# Patient Record
Sex: Female | Born: 1995 | Race: Black or African American | Hispanic: No | Marital: Single | Smoking: Never smoker
Health system: Southern US, Community
[De-identification: ages and names within clinical notes are randomized; demographics above are authoritative.]

## PROBLEM LIST (undated history)

## (undated) ENCOUNTER — Ambulatory Visit: Source: Home / Self Care

## (undated) ENCOUNTER — Emergency Department (HOSPITAL_COMMUNITY): Payer: Medicaid Other | Source: Home / Self Care

## (undated) ENCOUNTER — Inpatient Hospital Stay (HOSPITAL_COMMUNITY): Payer: Self-pay

## (undated) DIAGNOSIS — J45909 Unspecified asthma, uncomplicated: Secondary | ICD-10-CM

## (undated) DIAGNOSIS — K8020 Calculus of gallbladder without cholecystitis without obstruction: Secondary | ICD-10-CM

## (undated) DIAGNOSIS — J3089 Other allergic rhinitis: Secondary | ICD-10-CM

## (undated) DIAGNOSIS — K5900 Constipation, unspecified: Secondary | ICD-10-CM

## (undated) HISTORY — DX: Calculus of gallbladder without cholecystitis without obstruction: K80.20

## (undated) HISTORY — DX: Constipation, unspecified: K59.00

---
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
## 2022-01-06 ENCOUNTER — Encounter: Payer: Self-pay | Admitting: Obstetrics and Gynecology

## 2022-01-20 ENCOUNTER — Encounter: Payer: Self-pay | Admitting: Obstetrics and Gynecology

## 2022-04-04 ENCOUNTER — Encounter: Payer: Medicaid Other | Admitting: Obstetrics and Gynecology

## 2022-04-07 ENCOUNTER — Encounter: Payer: Self-pay | Admitting: Obstetrics and Gynecology

## 2022-04-07 ENCOUNTER — Ambulatory Visit (INDEPENDENT_AMBULATORY_CARE_PROVIDER_SITE_OTHER): Payer: Medicaid Other | Admitting: Obstetrics and Gynecology

## 2022-04-07 DIAGNOSIS — Z3046 Encounter for surveillance of implantable subdermal contraceptive: Secondary | ICD-10-CM | POA: Diagnosis not present

## 2022-04-07 DIAGNOSIS — Z30011 Encounter for initial prescription of contraceptive pills: Secondary | ICD-10-CM | POA: Diagnosis not present

## 2022-04-07 DIAGNOSIS — Z309 Encounter for contraceptive management, unspecified: Secondary | ICD-10-CM | POA: Insufficient documentation

## 2022-04-07 MED ORDER — DESOGESTREL-ETHINYL ESTRADIOL 0.15-30 MG-MCG PO TABS: 1.0000 | ORAL_TABLET | Freq: Every day | ORAL | 11 refills | Status: DC

## 2022-04-07 NOTE — Progress Notes (Signed)
? ? ? ?  GYNECOLOGY CLINIC PROCEDURE NOTE ? ?Leslie Henderson is a 26 y.o. No obstetric history on file, here for Nexplanon removal. Last pap smear was on 2020 and was normal. ? ? ?Nexplanon Removal ?Patient identified, informed consent performed, consent signed.   Appropriate time out taken. Nexplanon site identified.  Area prepped in usual sterile fashon. One ml of 1% lidocaine was used to anesthetize the area at the distal end of the implant. A small stab incision was made right beside the implant on the distal portion.  The Nexplanon rod was grasped using hemostats and removed without difficulty.  There was minimal blood loss. There were no complications.  3 ml of 1% lidocaine was injected around the incision for post-procedure analgesia.  Steri-strips were applied over the small incision.  A pressure bandage was applied to reduce any bruising.  The patient tolerated the procedure well and was given post procedure instructions.  Patient is planning to use OCP for contraception. ?U/R/B and back up method for OCP's reviewed. To start this Sunday ?Return in 2-3 weeks for yearly GYN exam.  ? ? ?Arlina Robes MD, FACOG ?Attending [place] for [employer], [place] ? ?  ?

## 2022-04-07 NOTE — Patient Instructions (Signed)

## 2022-04-07 NOTE — Progress Notes (Signed)
Pt tsates having vaginal odor, wants test. ?

## 2022-04-21 ENCOUNTER — Telehealth: Payer: Self-pay | Admitting: Family Medicine

## 2022-04-21 ENCOUNTER — Ambulatory Visit: Payer: BLUE CROSS/BLUE SHIELD

## 2022-04-21 NOTE — Telephone Encounter (Signed)
Called patient for our July schedule for an annual w/ a pap smear for after 4th ?

## 2022-08-16 ENCOUNTER — Ambulatory Visit: Payer: Self-pay

## 2022-10-17 ENCOUNTER — Encounter (HOSPITAL_BASED_OUTPATIENT_CLINIC_OR_DEPARTMENT_OTHER): Payer: Self-pay | Admitting: Emergency Medicine

## 2022-10-17 ENCOUNTER — Emergency Department (HOSPITAL_BASED_OUTPATIENT_CLINIC_OR_DEPARTMENT_OTHER)
Admission: EM | Admit: 2022-10-17 | Discharge: 2022-10-18 | Disposition: A | Discharge status: Home / Self Care | Payer: BLUE CROSS/BLUE SHIELD | Attending: Emergency Medicine | Admitting: Emergency Medicine

## 2022-10-17 ENCOUNTER — Other Ambulatory Visit: Payer: Self-pay

## 2022-10-17 DIAGNOSIS — R1013 Epigastric pain: Secondary | ICD-10-CM

## 2022-10-17 DIAGNOSIS — K8020 Calculus of gallbladder without cholecystitis without obstruction: Secondary | ICD-10-CM | POA: Insufficient documentation

## 2022-10-17 LAB — COMPREHENSIVE METABOLIC PANEL
ALT: 19 U/L (ref 0–44)
AST: 20 U/L (ref 15–41)
Albumin: 3.7 g/dL (ref 3.5–5.0)
Alkaline Phosphatase: 59 U/L (ref 38–126)
Anion gap: 8 (ref 5–15)
BUN: 11 mg/dL (ref 6–20)
CO2: 25 mmol/L (ref 22–32)
Calcium: 8.8 mg/dL — ABNORMAL LOW (ref 8.9–10.3)
Chloride: 107 mmol/L (ref 98–111)
Creatinine, Ser: 0.78 mg/dL (ref 0.44–1.00)
GFR, Estimated: >60 mL/min (ref >60)
Glucose, Bld: 94 mg/dL (ref 70–99)
Potassium: 3.4 mmol/L — ABNORMAL LOW (ref 3.5–5.1)
Sodium: 140 mmol/L (ref 135–145)
Total Bilirubin: 0.3 mg/dL (ref 0.3–1.2)
Total Protein: 7.9 g/dL (ref 6.5–8.1)

## 2022-10-17 LAB — CBC WITH DIFFERENTIAL/PLATELET
Abs Immature Granulocytes: 0.01 10*3/uL (ref 0.00–0.07)
Basophils Absolute: 0 10*3/uL (ref 0.0–0.1)
Basophils Relative: 1 %
Eosinophils Absolute: 0.2 10*3/uL (ref 0.0–0.5)
Eosinophils Relative: 3 %
HCT: 38.8 % (ref 36.0–46.0)
Hemoglobin: 12.6 g/dL (ref 12.0–15.0)
Immature Granulocytes: 0 %
Lymphocytes Relative: 44 %
Lymphs Abs: 2.5 10*3/uL (ref 0.7–4.0)
MCH: 26.3 pg (ref 26.0–34.0)
MCHC: 32.5 g/dL (ref 30.0–36.0)
MCV: 81 fL (ref 80.0–100.0)
Monocytes Absolute: 0.6 10*3/uL (ref 0.1–1.0)
Monocytes Relative: 11 %
Neutro Abs: 2.3 10*3/uL (ref 1.7–7.7)
Neutrophils Relative %: 41 %
Platelets: 306 10*3/uL (ref 150–400)
RBC: 4.79 MIL/uL (ref 3.87–5.11)
RDW: 13.5 % (ref 11.5–15.5)
WBC: 5.7 10*3/uL (ref 4.0–10.5)
nRBC: 0 % (ref 0.0–0.2)

## 2022-10-17 LAB — URINALYSIS, ROUTINE W REFLEX MICROSCOPIC
Bilirubin Urine: NEGATIVE
Glucose, UA: NEGATIVE mg/dL
Hgb urine dipstick: NEGATIVE
Ketones, ur: NEGATIVE mg/dL
Leukocytes,Ua: NEGATIVE
Nitrite: NEGATIVE
Protein, ur: 30 mg/dL — AB
Specific Gravity, Urine: 1.02 (ref 1.005–1.030)
pH: 7.5 (ref 5.0–8.0)

## 2022-10-17 LAB — URINALYSIS, MICROSCOPIC (REFLEX): WBC, UA: NONE SEEN WBC/hpf (ref 0–5)

## 2022-10-17 LAB — LIPASE, BLOOD: Lipase: 31 U/L (ref 11–51)

## 2022-10-17 LAB — TROPONIN I (HIGH SENSITIVITY): Troponin I (High Sensitivity): <2 ng/L (ref <18)

## 2022-10-17 LAB — PREGNANCY, URINE: Preg Test, Ur: NEGATIVE

## 2022-10-17 NOTE — ED Provider Notes (Signed)
[facility] Provider Note   CSN: [id number] Arrival date & time: 10/17/22  1735     History  Chief Complaint  Patient presents with   Abdominal Pain    EPIGASTRIC    Leslie Henderson is a 26 y.o. female.  Patient is a 26 year old female with past medical history of cholelithiasis diagnosed at the age of 83, but not intervened on.  Patient presenting today with complaints of epigastric pain.  This has been bothering her off and on for the past week.  It seems to be worse after she eats spicy or fatty foods.  She denies any fevers or chills.  She denies any vomiting, diarrhea, or constipation.  There are no alleviating factors when the pain begins.  The history is provided by the patient.  Abdominal Pain Pain location:  Epigastric Pain quality: cramping   Pain radiates to:  Does not radiate Pain severity:  Moderate Duration:  1 week Timing:  Intermittent Progression:  Worsening Chronicity:  Recurrent      Home Medications Prior to Admission medications   Medication Sig Start Date End Date Taking? Authorizing Provider  desogestrel-ethinyl estradiol (APRI) 0.15-30 MG-MCG tablet Take 1 tablet by mouth daily. 04/07/22 04/07/23  Chancy Milroy, MD      Allergies    Patient has no known allergies.    Review of Systems   Review of Systems  Gastrointestinal:  Positive for abdominal pain.  All other systems reviewed and are negative.   Physical Exam Updated Vital Signs BP (!) 139/55   Pulse 71   Temp 98.3 F (36.8 C)   Resp 16   Ht 5\' 4"  (1.626 m)   Wt 108.9 kg   LMP 09/30/2022   SpO2 99%   BMI 41.20 kg/m  Physical Exam Vitals and nursing note reviewed.  Constitutional:      General: She is not in acute distress.    Appearance: She is well-developed. She is not diaphoretic.  HENT:     Head: Normocephalic and atraumatic.  Cardiovascular:     Rate and Rhythm: Normal rate and regular rhythm.     Heart sounds: No murmur heard.     No friction rub. No gallop.  Pulmonary:     Effort: Pulmonary effort is normal. No respiratory distress.     Breath sounds: Normal breath sounds. No wheezing.  Abdominal:     General: Bowel sounds are normal. There is no distension.     Palpations: Abdomen is soft.     Tenderness: There is abdominal tenderness in the epigastric area. There is no right CVA tenderness, left CVA tenderness, guarding or rebound.  Musculoskeletal:        General: Normal range of motion.     Cervical back: Normal range of motion and neck supple.  Skin:    General: Skin is warm and dry.  Neurological:     General: No focal deficit present.     Mental Status: She is alert and oriented to person, place, and time.     ED Results / Procedures / Treatments   Labs (all labs ordered are listed, but only abnormal results are displayed) Labs Reviewed  COMPREHENSIVE METABOLIC PANEL - Abnormal; Notable for the following components:      Result Value   Potassium 3.4 (*)    Calcium 8.8 (*)    All other components within normal limits  URINALYSIS, ROUTINE W REFLEX MICROSCOPIC - Abnormal; Notable for the following components:   Protein, ur  30 (*)    All other components within normal limits  URINALYSIS, MICROSCOPIC (REFLEX) - Abnormal; Notable for the following components:   Bacteria, UA RARE (*)    All other components within normal limits  LIPASE, BLOOD  PREGNANCY, URINE  CBC WITH DIFFERENTIAL/PLATELET  TROPONIN I (HIGH SENSITIVITY)  TROPONIN I (HIGH SENSITIVITY)    EKG None  Radiology No results found.  Procedures Procedures  {Document cardiac monitor, telemetry assessment procedure when appropriate:1}  Medications Ordered in ED Medications - No data to display  ED Course/ Medical Decision Making/ A&P                           Medical Decision Making Amount and/or Complexity of Data Reviewed Labs: ordered. Radiology: ordered.   ***  {Document critical care time when  appropriate:1} {Document review of labs and clinical decision tools ie heart score, Chads2Vasc2 etc:1}  {Document your independent review of radiology images, and any outside records:1} {Document your discussion with family members, caretakers, and with consultants:1} {Document social determinants of health affecting pt's care:1} {Document your decision making why or why not admission, treatments were needed:1} Final Clinical Impression(s) / ED Diagnoses Final diagnoses:  None    Rx / DC Orders ED Discharge Orders     None

## 2022-10-17 NOTE — ED Triage Notes (Signed)
Intermittent epigastric abdominal pain x 1 week. Reports hx of gallstones. Thinks sx are related. Pain worsened after eating spicy foods per pt.

## 2022-10-18 ENCOUNTER — Emergency Department (HOSPITAL_BASED_OUTPATIENT_CLINIC_OR_DEPARTMENT_OTHER): Payer: BLUE CROSS/BLUE SHIELD

## 2022-10-18 ENCOUNTER — Telehealth (HOSPITAL_BASED_OUTPATIENT_CLINIC_OR_DEPARTMENT_OTHER): Payer: Self-pay | Admitting: Emergency Medicine

## 2022-10-18 DIAGNOSIS — K8020 Calculus of gallbladder without cholecystitis without obstruction: Secondary | ICD-10-CM | POA: Diagnosis not present

## 2022-10-18 MED ORDER — HYDROCODONE-ACETAMINOPHEN 5-325 MG PO TABS: 1.0000 | ORAL_TABLET | Freq: Four times a day (QID) | ORAL | 0 refills | Status: DC | PRN

## 2022-10-18 NOTE — Discharge Instructions (Addendum)
Avoid spicy or fatty foods as this may worsen your discomfort.  Take ibuprofen 600 mg every 6 hours as needed for pain.  Take hydrocodone as prescribed as needed for severe pain not relieved with ibuprofen.  Follow-up with general surgery in the next few days.  The contact information for [facility] surgery has been provided in this discharge summary for you to call and make these arrangements.  Return to the emergency department if you develop severe pain, high fevers, bloody stools, or for other new and concerning symptoms.

## 2022-10-18 NOTE — ED Notes (Signed)
US at bedside

## 2022-10-18 NOTE — ED Notes (Signed)
Provider bedside.

## 2022-10-18 NOTE — Telephone Encounter (Signed)
Patient needs meds sent to [facility]. Norco cancelled at [facility]

## 2023-01-02 DIAGNOSIS — A599 Trichomoniasis, unspecified: Secondary | ICD-10-CM | POA: Diagnosis not present

## 2023-01-02 DIAGNOSIS — B9689 Other specified bacterial agents as the cause of diseases classified elsewhere: Secondary | ICD-10-CM | POA: Diagnosis not present

## 2023-01-02 DIAGNOSIS — Z113 Encounter for screening for infections with a predominantly sexual mode of transmission: Secondary | ICD-10-CM | POA: Diagnosis not present

## 2023-01-02 DIAGNOSIS — N760 Acute vaginitis: Secondary | ICD-10-CM | POA: Diagnosis not present

## 2023-11-14 ENCOUNTER — Other Ambulatory Visit: Payer: Self-pay

## 2023-11-14 DIAGNOSIS — Z3490 Encounter for supervision of normal pregnancy, unspecified, unspecified trimester: Secondary | ICD-10-CM

## 2023-11-14 MED ORDER — PRENATAL 28-0.8 MG PO TABS: 1.0000 | ORAL_TABLET | Freq: Every day | ORAL | 12 refills | Status: DC

## 2023-11-23 ENCOUNTER — Encounter (HOSPITAL_COMMUNITY): Payer: Self-pay

## 2023-11-23 ENCOUNTER — Emergency Department (HOSPITAL_COMMUNITY)
Admission: EM | Admit: 2023-11-23 | Discharge: 2023-11-24 | Disposition: A | Discharge status: Home / Self Care | Payer: Medicaid Other | Attending: Emergency Medicine | Admitting: Emergency Medicine

## 2023-11-23 DIAGNOSIS — Z3A00 Weeks of gestation of pregnancy not specified: Secondary | ICD-10-CM | POA: Diagnosis not present

## 2023-11-23 DIAGNOSIS — O210 Mild hyperemesis gravidarum: Secondary | ICD-10-CM | POA: Diagnosis not present

## 2023-11-23 DIAGNOSIS — O219 Vomiting of pregnancy, unspecified: Secondary | ICD-10-CM | POA: Diagnosis present

## 2023-11-23 LAB — COMPREHENSIVE METABOLIC PANEL
ALT: 14 U/L (ref 0–44)
AST: 24 U/L (ref 15–41)
Albumin: 3.9 g/dL (ref 3.5–5.0)
Alkaline Phosphatase: 59 U/L (ref 38–126)
Anion gap: 10 (ref 5–15)
BUN: 13 mg/dL (ref 6–20)
CO2: 20 mmol/L — ABNORMAL LOW (ref 22–32)
Calcium: 9.1 mg/dL (ref 8.9–10.3)
Chloride: 101 mmol/L (ref 98–111)
Creatinine, Ser: 0.69 mg/dL (ref 0.44–1.00)
GFR, Estimated: >60 mL/min (ref >60)
Glucose, Bld: 88 mg/dL (ref 70–99)
Potassium: 3.2 mmol/L — ABNORMAL LOW (ref 3.5–5.1)
Sodium: 131 mmol/L — ABNORMAL LOW (ref 135–145)
Total Bilirubin: 0.9 mg/dL (ref <1.2)
Total Protein: 8.6 g/dL — ABNORMAL HIGH (ref 6.5–8.1)

## 2023-11-23 LAB — URINALYSIS, ROUTINE W REFLEX MICROSCOPIC
Bacteria, UA: NONE SEEN
Bilirubin Urine: NEGATIVE
Glucose, UA: NEGATIVE mg/dL
Hgb urine dipstick: NEGATIVE
Ketones, ur: 80 mg/dL — AB
Leukocytes,Ua: NEGATIVE
Nitrite: NEGATIVE
Protein, ur: 100 mg/dL — AB
Specific Gravity, Urine: 1.029 (ref 1.005–1.030)
pH: 5 (ref 5.0–8.0)

## 2023-11-23 LAB — CBC WITH DIFFERENTIAL/PLATELET
Abs Immature Granulocytes: 0.02 10*3/uL (ref 0.00–0.07)
Basophils Absolute: 0 10*3/uL (ref 0.0–0.1)
Basophils Relative: 0 %
Eosinophils Absolute: 0 10*3/uL (ref 0.0–0.5)
Eosinophils Relative: 0 %
HCT: 39.5 % (ref 36.0–46.0)
Hemoglobin: 12.9 g/dL (ref 12.0–15.0)
Immature Granulocytes: 0 %
Lymphocytes Relative: 24 %
Lymphs Abs: 1.8 10*3/uL (ref 0.7–4.0)
MCH: 26.5 pg (ref 26.0–34.0)
MCHC: 32.7 g/dL (ref 30.0–36.0)
MCV: 81.3 fL (ref 80.0–100.0)
Monocytes Absolute: 0.8 10*3/uL (ref 0.1–1.0)
Monocytes Relative: 10 %
Neutro Abs: 4.8 10*3/uL (ref 1.7–7.7)
Neutrophils Relative %: 66 %
Platelets: 334 10*3/uL (ref 150–400)
RBC: 4.86 MIL/uL (ref 3.87–5.11)
RDW: 13.7 % (ref 11.5–15.5)
WBC: 7.4 10*3/uL (ref 4.0–10.5)
nRBC: 0 % (ref 0.0–0.2)

## 2023-11-23 LAB — LIPASE, BLOOD: Lipase: 26 U/L (ref 11–51)

## 2023-11-23 LAB — PREGNANCY, URINE: Preg Test, Ur: POSITIVE — AB

## 2023-11-23 MED ORDER — ONDANSETRON HCL 4 MG/2ML IJ SOLN
4.0000 mg | Freq: Once | INTRAMUSCULAR | Status: AC
  Administered 2023-11-23: 4 mg via INTRAVENOUS
  Filled 2023-11-23: qty 2

## 2023-11-23 NOTE — ED Provider Triage Note (Signed)
Emergency Medicine Provider Triage Evaluation Note  Leslie Henderson , a 27 y.o. female  was evaluated in triage.  Pt complains of vomiting.  Patient reports she took and emergency contraception pill at approximately 5 AM this morning after finding out that she was pregnant.  Last missed period was on 10/07/2023.  She denies any vaginal bleeding or abdominal cramping.  She that she has had multiple episodes of vomiting diarrhea over the last 11 hours.  Has pain to the right upper quadrant/epigastrium.  Denies recent fever, chills or bodyaches.  Review of Systems  Positive: As above Negative: As above  Physical Exam  BP (!) 142/74 (BP Location: Right Arm)   Pulse 97   Temp 98.5 F (36.9 C) (Oral)   Resp 18   LMP 10/07/2023 (Approximate)   SpO2 97%  Gen:   Awake, emotional and likely having severe anxiety Resp:  Normal effort  MSK:   Moves extremities without difficulty  Other:  No significant tenderness to palpation in the abdomen, some mild right upper quadrant tenderness  Medical Decision Making  Medically screening exam initiated at 7:22 PM.  Appropriate orders placed.  Leslie Henderson was informed that the remainder of the evaluation will be completed by another provider, this initial triage assessment does not replace that evaluation, and the importance of remaining in the ED until their evaluation is complete.     Smitty Knudsen, PA-C 11/23/23 1924

## 2023-11-23 NOTE — ED Triage Notes (Signed)
Pt presents via EMS with c/o N/V/D for approx 11 hours. Pt took an abortion pill approx 5 am. Last period was 10/26. Pt denies any vaginal bleeding at this time.

## 2023-11-24 MED ORDER — SODIUM CHLORIDE 0.9 % IV BOLUS
1000.0000 mL | Freq: Once | INTRAVENOUS | Status: AC
  Administered 2023-11-24: 1000 mL via INTRAVENOUS

## 2023-11-24 MED ORDER — ONDANSETRON 4 MG PO TBDP: 4.0000 mg | ORAL_TABLET | Freq: Three times a day (TID) | ORAL | 0 refills | Status: DC | PRN

## 2023-11-24 MED ORDER — ONDANSETRON HCL 4 MG/2ML IJ SOLN
4.0000 mg | Freq: Once | INTRAMUSCULAR | Status: AC
  Administered 2023-11-24: 4 mg via INTRAVENOUS
  Filled 2023-11-24: qty 2

## 2023-11-24 NOTE — Discharge Instructions (Signed)
Take Zofran every 8 hours as needed to control nausea and vomiting.   It is important to follow up with your OB as soon as possible for further prenatal care and to discuss your having taken an abortion pill.   Return to the ED with any new or concerning symptoms at any time.

## 2023-11-24 NOTE — ED Provider Notes (Signed)
[name] AT [facility] Provider Note   CSN: [id number] Arrival date & time: 11/23/23  1826     History  Chief Complaint  Patient presents with   Emesis    Leslie Henderson is a 27 y.o. female.  Patient to ED with vomiting since Monday (4 days ago). She was aware she was pregnant [redacted] weeks ago but no vomiting until this week. No fever. She states today she took the first of a 5-pill regimen abortion pill she obtained online, she states, "because I wasn't thinking". She wants to continue the pregnancy. No lower abdominal pain or vaginal bleeding. She came to the emergency department for persistent vomiting.   The history is provided by the patient. No language interpreter was used.  Emesis      Home Medications Prior to Admission medications   Medication Sig Start Date End Date Taking? Authorizing Provider  ondansetron (ZOFRAN-ODT) 4 MG disintegrating tablet Take 1 tablet (4 mg total) by mouth every 8 (eight) hours as needed for nausea or vomiting. 11/24/23  Yes Elpidio Anis, PA-C  desogestrel-ethinyl estradiol (APRI) 0.15-30 MG-MCG tablet Take 1 tablet by mouth daily. 04/07/22 04/07/23  Hermina Staggers, MD  HYDROcodone-acetaminophen (NORCO) 5-325 MG tablet Take 1-2 tablets by mouth every 6 (six) hours as needed for moderate pain. 10/18/22   Harris, Cammy Copa, PA-C  Prenatal 28-0.8 MG TABS Take 1 tablet by mouth daily. 11/14/23   Levie Heritage, DO      Allergies    Patient has no known allergies.    Review of Systems   Review of Systems  Gastrointestinal:  Positive for vomiting.    Physical Exam Updated Vital Signs BP 113/71 (BP Location: Left Arm)   Pulse 88   Temp 98 F (36.7 C) (Oral)   Resp 18   LMP 10/07/2023 (Approximate)   SpO2 100%  Physical Exam Vitals and nursing note reviewed.  Constitutional:      General: She is not in acute distress.    Appearance: Normal appearance. She is well-developed. She is not ill-appearing.   HENT:     Head: Normocephalic.  Cardiovascular:     Rate and Rhythm: Normal rate and regular rhythm.     Heart sounds: No murmur heard. Pulmonary:     Effort: Pulmonary effort is normal.     Breath sounds: Normal breath sounds.  Abdominal:     General: Bowel sounds are normal.     Palpations: Abdomen is soft.     Tenderness: There is no abdominal tenderness. There is no guarding or rebound.  Musculoskeletal:        General: Normal range of motion.     Cervical back: Normal range of motion and neck supple.  Skin:    General: Skin is warm and dry.  Neurological:     General: No focal deficit present.     Mental Status: She is alert and oriented to person, place, and time.     ED Results / Procedures / Treatments   Labs (all labs ordered are listed, but only abnormal results are displayed) Labs Reviewed  COMPREHENSIVE METABOLIC PANEL - Abnormal; Notable for the following components:      Result Value   Sodium 131 (*)    Potassium 3.2 (*)    CO2 20 (*)    Total Protein 8.6 (*)    All other components within normal limits  URINALYSIS, ROUTINE W REFLEX MICROSCOPIC - Abnormal; Notable for the following components:   Color,  Urine AMBER (*)    APPearance CLOUDY (*)    Ketones, ur 80 (*)    Protein, ur 100 (*)    All other components within normal limits  PREGNANCY, URINE - Abnormal; Notable for the following components:   Preg Test, Ur POSITIVE (*)    All other components within normal limits  CBC WITH DIFFERENTIAL/PLATELET  LIPASE, BLOOD    EKG None  Radiology No results found.  Procedures Procedures    Medications Ordered in ED Medications  ondansetron (ZOFRAN) injection 4 mg (4 mg Intravenous Given 11/23/23 1953)  sodium chloride 0.9 % bolus 1,000 mL (1,000 mLs Intravenous New Bag/Given 11/24/23 0033)  ondansetron (ZOFRAN) injection 4 mg (4 mg Intravenous Given 11/24/23 0030)    ED Course/ Medical Decision Making/ A&P Clinical Course as of 11/24/23 0140   Fri Nov 24, 2023  0133 Patient to ED with nausea and vomiting of pregnancy. VSS. No vomiting in ED. Zofran provided with resolution of vomiting. Tolerating PO fluids.She reports having taken a single dose of "an abortion pill". No vaginal bleeding or lower abdominal pain. She is advised to follow up with OB/GYN for further care.  [SU]    Clinical Course User Index [SU] Elpidio Anis, PA-C                                 Medical Decision Making Risk Prescription drug management.           Final Clinical Impression(s) / ED Diagnoses Final diagnoses:  Hyperemesis gravidarum    Rx / DC Orders ED Discharge Orders          Ordered    ondansetron (ZOFRAN-ODT) 4 MG disintegrating tablet  Every 8 hours PRN        11/24/23 0139              Elpidio Anis, PA-C 11/24/23 0140    Dione Booze, MD 12/01/23 2255

## 2023-11-26 ENCOUNTER — Emergency Department (HOSPITAL_COMMUNITY)
Admission: EM | Admit: 2023-11-26 | Discharge: 2023-11-27 | Disposition: A | Discharge status: Home / Self Care | Payer: Medicaid Other | Attending: Emergency Medicine | Admitting: Emergency Medicine

## 2023-11-26 ENCOUNTER — Other Ambulatory Visit: Payer: Self-pay

## 2023-11-26 ENCOUNTER — Encounter (HOSPITAL_COMMUNITY): Payer: Self-pay | Admitting: Emergency Medicine

## 2023-11-26 ENCOUNTER — Emergency Department (HOSPITAL_COMMUNITY): Payer: Medicaid Other

## 2023-11-26 DIAGNOSIS — O26891 Other specified pregnancy related conditions, first trimester: Secondary | ICD-10-CM | POA: Insufficient documentation

## 2023-11-26 DIAGNOSIS — K8020 Calculus of gallbladder without cholecystitis without obstruction: Secondary | ICD-10-CM | POA: Insufficient documentation

## 2023-11-26 DIAGNOSIS — Z3A00 Weeks of gestation of pregnancy not specified: Secondary | ICD-10-CM | POA: Diagnosis not present

## 2023-11-26 DIAGNOSIS — O209 Hemorrhage in early pregnancy, unspecified: Secondary | ICD-10-CM | POA: Insufficient documentation

## 2023-11-26 DIAGNOSIS — Z3A01 Less than 8 weeks gestation of pregnancy: Secondary | ICD-10-CM | POA: Diagnosis not present

## 2023-11-26 DIAGNOSIS — Z332 Encounter for elective termination of pregnancy: Secondary | ICD-10-CM

## 2023-11-26 LAB — COMPREHENSIVE METABOLIC PANEL
ALT: 30 U/L (ref 0–44)
AST: 25 U/L (ref 15–41)
Albumin: 3.6 g/dL (ref 3.5–5.0)
Alkaline Phosphatase: 52 U/L (ref 38–126)
Anion gap: 11 (ref 5–15)
BUN: 7 mg/dL (ref 6–20)
CO2: 22 mmol/L (ref 22–32)
Calcium: 9 mg/dL (ref 8.9–10.3)
Chloride: 102 mmol/L (ref 98–111)
Creatinine, Ser: 0.86 mg/dL (ref 0.44–1.00)
GFR, Estimated: >60 mL/min (ref >60)
Glucose, Bld: 130 mg/dL — ABNORMAL HIGH (ref 70–99)
Potassium: 2.9 mmol/L — ABNORMAL LOW (ref 3.5–5.1)
Sodium: 135 mmol/L (ref 135–145)
Total Bilirubin: 0.4 mg/dL (ref <1.2)
Total Protein: 7.5 g/dL (ref 6.5–8.1)

## 2023-11-26 LAB — DIFFERENTIAL
Abs Immature Granulocytes: 0.03 10*3/uL (ref 0.00–0.07)
Basophils Absolute: 0 10*3/uL (ref 0.0–0.1)
Basophils Relative: 1 %
Eosinophils Absolute: 0 10*3/uL (ref 0.0–0.5)
Eosinophils Relative: 1 %
Immature Granulocytes: 0 %
Lymphocytes Relative: 19 %
Lymphs Abs: 1.4 10*3/uL (ref 0.7–4.0)
Monocytes Absolute: 0.7 10*3/uL (ref 0.1–1.0)
Monocytes Relative: 10 %
Neutro Abs: 5 10*3/uL (ref 1.7–7.7)
Neutrophils Relative %: 69 %

## 2023-11-26 LAB — I-STAT CHEM 8, ED
BUN: 6 mg/dL (ref 6–20)
Calcium, Ion: 1.04 mmol/L — ABNORMAL LOW (ref 1.15–1.40)
Chloride: 102 mmol/L (ref 98–111)
Creatinine, Ser: 0.8 mg/dL (ref 0.44–1.00)
Glucose, Bld: 130 mg/dL — ABNORMAL HIGH (ref 70–99)
HCT: 36 % (ref 36.0–46.0)
Hemoglobin: 12.2 g/dL (ref 12.0–15.0)
Potassium: 3 mmol/L — ABNORMAL LOW (ref 3.5–5.1)
Sodium: 139 mmol/L (ref 135–145)
TCO2: 24 mmol/L (ref 22–32)

## 2023-11-26 LAB — URINALYSIS, ROUTINE W REFLEX MICROSCOPIC
Bilirubin Urine: NEGATIVE
Glucose, UA: NEGATIVE mg/dL
Ketones, ur: NEGATIVE mg/dL
Leukocytes,Ua: NEGATIVE
Nitrite: NEGATIVE
Protein, ur: 100 mg/dL — AB
RBC / HPF: >50 RBC/hpf (ref 0–5)
Specific Gravity, Urine: 1.024 (ref 1.005–1.030)
pH: 7 (ref 5.0–8.0)

## 2023-11-26 LAB — CBC
HCT: 35.5 % — ABNORMAL LOW (ref 36.0–46.0)
Hemoglobin: 11.6 g/dL — ABNORMAL LOW (ref 12.0–15.0)
MCH: 26.7 pg (ref 26.0–34.0)
MCHC: 32.7 g/dL (ref 30.0–36.0)
MCV: 81.8 fL (ref 80.0–100.0)
Platelets: 305 10*3/uL (ref 150–400)
RBC: 4.34 MIL/uL (ref 3.87–5.11)
RDW: 13.6 % (ref 11.5–15.5)
WBC: 7.2 10*3/uL (ref 4.0–10.5)
nRBC: 0 % (ref 0.0–0.2)

## 2023-11-26 LAB — HCG, QUANTITATIVE, PREGNANCY: hCG, Beta Chain, Quant, S: 17515 m[IU]/mL — ABNORMAL HIGH (ref <5)

## 2023-11-26 LAB — LIPASE, BLOOD: Lipase: 26 U/L (ref 11–51)

## 2023-11-26 MED ORDER — ONDANSETRON 4 MG PO TBDP
8.0000 mg | ORAL_TABLET | Freq: Once | ORAL | Status: AC
  Administered 2023-11-27: 8 mg via ORAL
  Filled 2023-11-26: qty 2

## 2023-11-26 NOTE — ED Triage Notes (Signed)
Pt from home, c/o epigastric pn, she has hx of gallstones and was recommended for surgery, has refused (is afraid).  She ate bacon for dinner tonight. Feels the same.  Took an abortion pill 1 week ago.  150/100 P 90 100%RA

## 2023-11-26 NOTE — ED Provider Triage Note (Signed)
Emergency Medicine Provider Triage Evaluation Note  Iretta Burgueno , a 27 y.o. female  was evaluated in triage.  Pt complains of complaint of gallstone attack.  States feels similar to previous episodes however this has persisted longer than usual.  Endorses nausea and vomiting.  Took an abortion pill last week.  Denies any complaints from the standpoint and states "I know what this is"  Review of Systems  Positive: As above Negative: As above  Physical Exam  Ht 5\' 3"  (1.6 m)   Wt 108.9 kg   LMP 10/07/2023 (Approximate)   BMI 42.51 kg/m  Gen:   Awake, no distress  Resp:  Normal effort MSK:   Moves extremities without difficulty  Other:    Medical Decision Making  Medically screening exam initiated at 9:15 PM.  Appropriate orders placed.  Ronan Bataille was informed that the remainder of the evaluation will be completed by another provider, this initial triage assessment does not replace that evaluation, and the importance of remaining in the ED until their evaluation is complete.     Marita Kansas, PA-C 11/26/23 2116

## 2023-11-27 ENCOUNTER — Emergency Department (HOSPITAL_COMMUNITY): Payer: Medicaid Other

## 2023-11-27 ENCOUNTER — Encounter (HOSPITAL_COMMUNITY): Payer: Self-pay | Admitting: Emergency Medicine

## 2023-11-27 DIAGNOSIS — Z3A01 Less than 8 weeks gestation of pregnancy: Secondary | ICD-10-CM

## 2023-11-27 DIAGNOSIS — O209 Hemorrhage in early pregnancy, unspecified: Secondary | ICD-10-CM

## 2023-11-27 LAB — ABO/RH: ABO/RH(D): A POS

## 2023-11-27 MED ORDER — HYDROMORPHONE HCL 1 MG/ML IJ SOLN
1.0000 mg | Freq: Once | INTRAMUSCULAR | Status: AC
  Administered 2023-11-27: 1 mg via INTRAVENOUS
  Filled 2023-11-27: qty 1

## 2023-11-27 MED ORDER — ONDANSETRON HCL 4 MG PO TABS: 4.0000 mg | ORAL_TABLET | Freq: Three times a day (TID) | ORAL | 0 refills | Status: DC | PRN

## 2023-11-27 MED ORDER — MORPHINE SULFATE (PF) 4 MG/ML IV SOLN
4.0000 mg | Freq: Once | INTRAVENOUS | Status: AC
Start: 2023-11-27 — End: 2023-11-27
  Administered 2023-11-27: 4 mg via INTRAVENOUS
  Filled 2023-11-27: qty 1

## 2023-11-27 MED ORDER — HYDROCODONE-ACETAMINOPHEN 5-325 MG PO TABS: 1.0000 | ORAL_TABLET | Freq: Four times a day (QID) | ORAL | 0 refills | Status: DC | PRN

## 2023-11-27 MED ORDER — SODIUM CHLORIDE 0.9 % IV BOLUS
1000.0000 mL | Freq: Once | INTRAVENOUS | Status: AC
  Administered 2023-11-27: 1000 mL via INTRAVENOUS

## 2023-11-27 NOTE — Discharge Instructions (Addendum)
I have sent a prescription to the pharmacy for pain medication.  If your pain is not controlled with this medication or you develop intractable nausea and vomiting please return immediately to the emergency department.  It is important that you follow-up with a medical provider in 48 hours to recheck your HCG levels.

## 2023-11-27 NOTE — ED Provider Notes (Signed)
[place] EMERGENCY DEPARTMENT AT [facility] Provider Note   CSN: [id number] Arrival date & time: 11/26/23  2113     History  Chief Complaint  Patient presents with   Abdominal Pain    Leslie Henderson is a 27 y.o. female.  Patient presents to the emergency department complaining of right upper quadrant abdominal pain which began at 6 PM.  Patient states she ate fatty foods including bacon earlier in the day prior to onset of symptoms.  She states that in the past she has been referred to general surgery for evaluation of cholelithiasis but has never followed up.  Tonight she complains of right upper quadrant abdominal pain, 10 out of 10 in severity, nausea, vomiting, diarrhea.  Patient also recently took an abortion pill on Thursday of this week.  After taking the "abortion pill", first pill of a 5 pill regimen, she decided she wanted to continue the pregnancy.  She is complaining of vaginal bleeding at this time.  She was seen in the emergency department for persistent vomiting on December 12.  Patient denies chest pain, urinary symptoms, shortness of breath, fevers.   Abdominal Pain      Home Medications Prior to Admission medications   Medication Sig Start Date End Date Taking? Authorizing Provider  HYDROcodone-acetaminophen (NORCO/VICODIN) 5-325 MG tablet Take 1 tablet by mouth every 6 (six) hours as needed for severe pain (pain score 7-10). 11/27/23  Yes Barrie Dunker B, PA-C  ondansetron (ZOFRAN) 4 MG tablet Take 1 tablet (4 mg total) by mouth every 8 (eight) hours as needed for nausea or vomiting. 11/27/23  Yes Darrick Grinder, PA-C  desogestrel-ethinyl estradiol (APRI) 0.15-30 MG-MCG tablet Take 1 tablet by mouth daily. 04/07/22 04/07/23  Hermina Staggers, MD  HYDROcodone-acetaminophen (NORCO) 5-325 MG tablet Take 1-2 tablets by mouth every 6 (six) hours as needed for moderate pain. 10/18/22   Harris, Cammy Copa, PA-C  ondansetron (ZOFRAN-ODT) 4 MG disintegrating  tablet Take 1 tablet (4 mg total) by mouth every 8 (eight) hours as needed for nausea or vomiting. 11/24/23   Elpidio Anis, PA-C  Prenatal 28-0.8 MG TABS Take 1 tablet by mouth daily. 11/14/23   Levie Heritage, DO      Allergies    Patient has no known allergies.    Review of Systems   Review of Systems  Gastrointestinal:  Positive for abdominal pain.    Physical Exam Updated Vital Signs BP (!) 145/83   Pulse 92   Temp 98.3 F (36.8 C) (Oral)   Resp 15   Ht 5\' 3"  (1.6 m)   Wt 108.9 kg   LMP 10/07/2023 (Approximate)   SpO2 100%   BMI 42.51 kg/m  Physical Exam Vitals and nursing note reviewed.  Constitutional:      General: She is in acute distress.     Appearance: She is well-developed.  HENT:     Head: Normocephalic and atraumatic.  Eyes:     Conjunctiva/sclera: Conjunctivae normal.  Cardiovascular:     Rate and Rhythm: Normal rate and regular rhythm.  Pulmonary:     Effort: Pulmonary effort is normal. No respiratory distress.     Breath sounds: Normal breath sounds.  Abdominal:     Palpations: Abdomen is soft.     Tenderness: There is abdominal tenderness in the right upper quadrant.  Musculoskeletal:        General: No swelling.     Cervical back: Neck supple.  Skin:    General: Skin is  warm and dry.     Capillary Refill: Capillary refill takes less than 2 seconds.  Neurological:     Mental Status: She is alert.  Psychiatric:        Mood and Affect: Mood normal.     ED Results / Procedures / Treatments   Labs (all labs ordered are listed, but only abnormal results are displayed) Labs Reviewed  COMPREHENSIVE METABOLIC PANEL - Abnormal; Notable for the following components:      Result Value   Potassium 2.9 (*)    Glucose, Bld 130 (*)    All other components within normal limits  CBC - Abnormal; Notable for the following components:   Hemoglobin 11.6 (*)    HCT 35.5 (*)    All other components within normal limits  URINALYSIS, ROUTINE W REFLEX  MICROSCOPIC - Abnormal; Notable for the following components:   Color, Urine AMBER (*)    APPearance CLOUDY (*)    Hgb urine dipstick LARGE (*)    Protein, ur 100 (*)    Bacteria, UA RARE (*)    All other components within normal limits  HCG, QUANTITATIVE, PREGNANCY - Abnormal; Notable for the following components:   hCG, Beta Chain, Quant, S 17,515 (*)    All other components within normal limits  I-STAT CHEM 8, ED - Abnormal; Notable for the following components:   Potassium 3.0 (*)    Glucose, Bld 130 (*)    Calcium, Ion 1.04 (*)    All other components within normal limits  LIPASE, BLOOD  DIFFERENTIAL  ABO/RH    EKG EKG Interpretation Date/Time:  Monday November 27 2023 01:59:04 EST Ventricular Rate:  77 PR Interval:  143 QRS Duration:  72 QT Interval:  397 QTC Calculation: 450 R Axis:   52  Text Interpretation: Sinus rhythm Ventricular premature complex Confirmed by Zadie Rhine ([id number]) on 11/27/2023 2:01:25 AM  Radiology US OB LESS THAN 14 WEEKS WITH OB TRANSVAGINAL Result Date: 11/27/2023 CLINICAL DATA:  Vomiting and abdominal pain during pregnancy. EXAM: OBSTETRIC <14 WK [place] AND TRANSVAGINAL OB US TECHNIQUE: Both transabdominal and transvaginal ultrasound examinations were performed for complete evaluation of the gestation as well as the maternal uterus, adnexal regions, and pelvic cul-de-sac. Transvaginal technique was performed to assess early pregnancy. COMPARISON:  None Available. FINDINGS: Intrauterine gestational sac: None Endometrium: Heterogeneous and thickened measuring 1.7 cm. No internal vascularity. Maternal uterus/adnexae: The bilateral ovaries are visualized and appear within normal limits. There is no pelvic free fluid. Cervix: Closed. IMPRESSION: 1. No intrauterine gestational sac identified. Thickened heterogeneous endometrium without vascularity. In the setting of a positive pregnancy test, findings may be related to recently completed abortion. Occult  ectopic pregnancy and early normal IUP are also in the differential. Recommend clinical correlation and follow-up. Electronically Signed   By: Darliss Cheney M.D.   On: 11/27/2023 01:51   US Abdomen Limited RUQ (LIVER/GB) Result Date: 11/26/2023 CLINICAL DATA:  Right upper quadrant abdominal pain EXAM: ULTRASOUND ABDOMEN LIMITED RIGHT UPPER QUADRANT COMPARISON:  Abdominal ultrasound 10/18/2022 FINDINGS: Gallbladder: Gallstones are present measuring up to 1.5 cm. Gallbladder sludge is also present. There is gallbladder wall thickening measuring up to 8.3 mm. There is no pericholecystic fluid. No sonographic Murphy sign noted by sonographer. Common bile duct: Diameter: 6 mm Liver: No focal lesion identified. Increase in parenchymal echogenicity. Portal vein is patent on color Doppler imaging with normal direction of blood flow towards the liver. Other: None. IMPRESSION: 1. Cholelithiasis with gallbladder wall thickening. No pericholecystic fluid or  sonographic Murphy sign. Findings are equivocal for acute cholecystitis. If there is clinical concern for acute cholecystitis, consider further evaluation with HIDA scan. 2. Increased hepatic parenchymal echogenicity suggestive of steatosis. Electronically Signed   By: Darliss Cheney M.D.   On: 11/26/2023 23:08    Procedures Procedures    Medications Ordered in ED Medications  ondansetron (ZOFRAN-ODT) disintegrating tablet 8 mg (8 mg Oral Given 11/27/23 0041)  morphine (PF) 4 MG/ML injection 4 mg (4 mg Intravenous Given 11/27/23 0045)  HYDROmorphone (DILAUDID) injection 1 mg (1 mg Intravenous Given 11/27/23 0201)  sodium chloride 0.9 % bolus 1,000 mL (1,000 mLs Intravenous New Bag/Given 11/27/23 0330)  HYDROmorphone (DILAUDID) injection 1 mg (1 mg Intravenous Given 11/27/23 0458)    ED Course/ Medical Decision Making/ A&P                                 Medical Decision Making Amount and/or Complexity of Data Reviewed Labs: ordered. Radiology:  ordered. ECG/medicine tests: ordered.  Risk Prescription drug management.   This patient presents to the ED for concern of abdominal pain, this involves an extensive number of treatment options, and is a complaint that carries with it a high risk of complications and morbidity.  The differential diagnosis includes cholecystitis, biliary colic, gastroenteritis, others   Co morbidities that complicate the patient evaluation  Current pregnancy, cholelithiasis history   Additional history obtained:  Additional history obtained from EMS External records from outside source obtained and reviewed including OB/GYN notes   Lab Tests:  I Ordered, and personally interpreted labs.  The pertinent results include: Beta hCG 17,515, potassium 2.9, hemoglobin 11.6   Imaging Studies ordered:  I ordered imaging studies including  right upper quadrant abdominal ultrasound and OB ultrasound I independently visualized and interpreted imaging which showed  1. Cholelithiasis with gallbladder wall thickening. No  pericholecystic fluid or sonographic Murphy sign. Findings are  equivocal for acute cholecystitis. If there is clinical concern for  acute cholecystitis, consider further evaluation with HIDA scan.  2. Increased hepatic parenchymal echogenicity suggestive of  steatosis.   1. No intrauterine gestational sac identified. Thickened  heterogeneous endometrium without vascularity. In the setting of a  positive pregnancy test, findings may be related to recently  completed abortion. Occult ectopic pregnancy and early normal IUP  are also in the differential. Recommend clinical correlation and  follow-up.   I agree with the radiologist interpretation   Cardiac Monitoring: / EKG:  The patient was maintained on a cardiac monitor.  I personally viewed and interpreted the cardiac monitored which showed an underlying rhythm of: Sinus rhythm   Consultations Obtained:  I requested consultation  with the on-call OB/GYN, Dr.Forsyth,  and discussed lab and imaging findings as well as pertinent plan - they recommend: Repeat hCG in 2 days.  They reviewed the ultrasound and stated that they see no free fluid, no sign of ectopic/ruptured ectopic pregnancy.  Believe the patient may be having a completed abortion.  Requested consultation with the on-call surgeon, Dr. Bedelia Person, who stated that if pain was able to be under control the patient could likely follow-up as an outpatient for elective cholecystectomy.   Problem List / ED Course / Critical interventions / Medication management   I ordered medication including morphine and Dilaudid for pain, Zofran for nausea Reevaluation of the patient after these medicines showed that the patient improved I have reviewed the patients home medicines and have  made adjustments as needed   Social Determinants of Health:  Patient has Medicaid for her primary health insurance type   Test / Admission - Considered:  Patient feeling better after pain medication.  Plan to discharge home with prescription for Norco and strict return precautions including worsening pain, fever, intractable nausea and vomiting.  Patient will need close follow-up with general surgery for likely cholecystectomy.  Patient also advised to get repeat hCG in 48 hours to ensure that levels are dropping.         Final Clinical Impression(s) / ED Diagnoses Final diagnoses:  Abortion in first trimester  Calculus of gallbladder without cholecystitis without obstruction    Rx / DC Orders ED Discharge Orders          Ordered    HYDROcodone-acetaminophen (NORCO/VICODIN) 5-325 MG tablet  Every 6 hours PRN        11/27/23 0610    ondansetron (ZOFRAN) 4 MG tablet  Every 8 hours PRN        11/27/23 0610              Darrick Grinder, PA-C 11/27/23 1610    Zadie Rhine, MD 11/27/23 [phone]

## 2023-11-27 NOTE — Consult Note (Signed)
   OB/GYN Telephone Consult  11/27/2023   Leslie Henderson is a 27 y.o. [id number] with recent self-managed abortion at [redacted]w[redacted]d by LMP presenting to [facility].   I was called for a consult regarding the care of this patient by the PA caring for the patient.   The provider had the following clinical question: Pregnancy considerations in s/o possible cholecystitis, potential for [facility] specialty admission with surgery consult  The provider presented the following relevant clinical information:  Patient presented with severe RUQ/epigastric pain in s/o known gallstones. She recently took medications for a self-managed abortion on 12/12 but changed her mind and didn't not complete the regimen. It is unclear what medication she took. She is having vaginal bleeding.   In the ED, she has been afebrile with normal heart rate and Bps 120-160s/90-100s. No leukocytosis, Hgb 11.6, hCG 17515, Rh+. Pelvic US w/ no e/o IUP or EUP, no free fluid in pelvis, ovaries were visualized and normal RUQ [place] w/ cholelithiasis and gallbladder wall thickening equivocal for cholecystitis  I performed a chart review on the patient and reviewed available documentation.  BP 137/88 (BP Location: Left Arm)   Pulse 85   Temp 98.3 F (36.8 C) (Oral)   Resp 19   Ht 5\' 3"  (1.6 m)   Wt 108.9 kg   LMP 10/07/2023 (Approximate)   SpO2 100%   BMI 42.51 kg/m   Exam- performed by consulting provider  Recommendations:  - Findings are most consistent with complete abortion.  - At this BhCG level, we would expect to see a pregnancy within the uterus. She has no evidence of ectopic pregnancy (ruptured or not) on her pelvic & abdominal ultrasounds. This makes a complete abortion most likely  - Follow up BhCG in 48 hours to confirm diagnosis since she does not have a prior US confirming IUP. It appears she has followed with both [facility] and a gynecologist through [facility]. If she is discharged within 48 hours,  she can follow up with either provider - Her positive BhCG should otherwise not change management of her cholelithiasis  Thank you for this consult and if additional recommendations are needed please call [phone] for the OB/GYN attending on service at [facility].   I spent approximately 5 minutes directly consulting with the provider and verbally discussing this case. Additionally 15 minutes minutes was spent performing chart review and documentation.    Lennart Pall, MD

## 2023-11-28 ENCOUNTER — Ambulatory Visit (HOSPITAL_COMMUNITY)
Admission: EM | Admit: 2023-11-28 | Discharge: 2023-11-29 | Disposition: A | Discharge status: Home / Self Care | Payer: Medicaid Other

## 2023-11-28 ENCOUNTER — Emergency Department (HOSPITAL_COMMUNITY): Payer: Medicaid Other

## 2023-11-28 ENCOUNTER — Other Ambulatory Visit: Payer: Self-pay

## 2023-11-28 DIAGNOSIS — K8012 Calculus of gallbladder with acute and chronic cholecystitis without obstruction: Secondary | ICD-10-CM | POA: Insufficient documentation

## 2023-11-28 DIAGNOSIS — Z6841 Body Mass Index (BMI) 40.0 and over, adult: Secondary | ICD-10-CM | POA: Diagnosis not present

## 2023-11-28 DIAGNOSIS — K819 Cholecystitis, unspecified: Secondary | ICD-10-CM | POA: Diagnosis present

## 2023-11-28 DIAGNOSIS — J45909 Unspecified asthma, uncomplicated: Secondary | ICD-10-CM | POA: Diagnosis not present

## 2023-11-28 DIAGNOSIS — K658 Other peritonitis: Secondary | ICD-10-CM | POA: Diagnosis not present

## 2023-11-28 LAB — CBC WITH DIFFERENTIAL/PLATELET
Abs Immature Granulocytes: 0.04 10*3/uL (ref 0.00–0.07)
Basophils Absolute: 0 10*3/uL (ref 0.0–0.1)
Basophils Relative: 0 %
Eosinophils Absolute: 0.1 10*3/uL (ref 0.0–0.5)
Eosinophils Relative: 1 %
HCT: 36.5 % (ref 36.0–46.0)
Hemoglobin: 11.9 g/dL — ABNORMAL LOW (ref 12.0–15.0)
Immature Granulocytes: 0 %
Lymphocytes Relative: 19 %
Lymphs Abs: 2.2 10*3/uL (ref 0.7–4.0)
MCH: 26.7 pg (ref 26.0–34.0)
MCHC: 32.6 g/dL (ref 30.0–36.0)
MCV: 81.8 fL (ref 80.0–100.0)
Monocytes Absolute: 1.2 10*3/uL — ABNORMAL HIGH (ref 0.1–1.0)
Monocytes Relative: 10 %
Neutro Abs: 8 10*3/uL — ABNORMAL HIGH (ref 1.7–7.7)
Neutrophils Relative %: 70 %
Platelets: 304 10*3/uL (ref 150–400)
RBC: 4.46 MIL/uL (ref 3.87–5.11)
RDW: 13.5 % (ref 11.5–15.5)
WBC: 11.5 10*3/uL — ABNORMAL HIGH (ref 4.0–10.5)
nRBC: 0 % (ref 0.0–0.2)

## 2023-11-28 LAB — COMPREHENSIVE METABOLIC PANEL
ALT: 24 U/L (ref 0–44)
AST: 22 U/L (ref 15–41)
Albumin: 3.3 g/dL — ABNORMAL LOW (ref 3.5–5.0)
Alkaline Phosphatase: 58 U/L (ref 38–126)
Anion gap: 12 (ref 5–15)
BUN: 5 mg/dL — ABNORMAL LOW (ref 6–20)
CO2: 26 mmol/L (ref 22–32)
Calcium: 9 mg/dL (ref 8.9–10.3)
Chloride: 97 mmol/L — ABNORMAL LOW (ref 98–111)
Creatinine, Ser: 0.84 mg/dL (ref 0.44–1.00)
GFR, Estimated: >60 mL/min (ref >60)
Glucose, Bld: 98 mg/dL (ref 70–99)
Potassium: 2.7 mmol/L — CL (ref 3.5–5.1)
Sodium: 135 mmol/L (ref 135–145)
Total Bilirubin: 0.7 mg/dL (ref <1.2)
Total Protein: 7.5 g/dL (ref 6.5–8.1)

## 2023-11-28 LAB — LIPASE, BLOOD: Lipase: 21 U/L (ref 11–51)

## 2023-11-28 MED ORDER — FENTANYL CITRATE PF 50 MCG/ML IJ SOSY
50.0000 ug | PREFILLED_SYRINGE | Freq: Once | INTRAMUSCULAR | Status: AC
Start: 2023-11-28 — End: 2023-11-28
  Administered 2023-11-28: 50 ug via INTRAMUSCULAR
  Filled 2023-11-28: qty 1

## 2023-11-28 NOTE — ED Provider Triage Note (Signed)
Emergency Medicine Provider Triage Evaluation Note  Leslie Henderson , a 27 y.o. female  was evaluated in triage.  Pt complains of continuing RUQ pain. Diagnosed with gall stones on Sunday. Went to surgical clinic today, but patient states they were not able to see her as she arrived late for the appointment. She reports pain medication is only making her sleepy, but not improving the pain  Review of Systems  Positive: RUQ pain radiating to R flank Negative: Dysuria, blood in urine  Physical Exam  BP 136/78 (BP Location: Right Arm)   Pulse 95   Temp 99.4 F (37.4 C) (Oral)   Resp 20   Ht 5\' 3"  (1.6 m)   Wt 108.9 kg   LMP 10/07/2023 (Approximate)   SpO2 100%   BMI 42.51 kg/m  Gen:   Awake, appears uncomfortable Resp:  Normal effort  MSK:   Moves extremities without difficulty  Other:  RUQ TTP  Medical Decision Making  Medically screening exam initiated at 7:10 PM.  Appropriate orders placed.  Jole Brannam was informed that the remainder of the evaluation will be completed by another provider, this initial triage assessment does not replace that evaluation, and the importance of remaining in the ED until their evaluation is complete.     [name], NP 11/28/23 2103

## 2023-11-28 NOTE — ED Triage Notes (Signed)
Pt here for RUQ abd pain states that she was seen on Sunday for her gallbladder and is supposed to have surgery but has not yet and cannot be seen in office until January. Was supposed to go for appointment today but did not arrive on time and was not able to be seen. Coming in today for uncontrolled pain.

## 2023-11-28 NOTE — ED Notes (Signed)
Abnormal pottasium result reported to PA at triage .

## 2023-11-29 ENCOUNTER — Encounter (HOSPITAL_COMMUNITY): Payer: Self-pay

## 2023-11-29 ENCOUNTER — Observation Stay (HOSPITAL_COMMUNITY): Payer: Medicaid Other | Admitting: Certified Registered"

## 2023-11-29 ENCOUNTER — Encounter (HOSPITAL_COMMUNITY)
Admission: EM | Disposition: A | Discharge status: Home / Self Care | Payer: Self-pay | Source: Home / Self Care | Attending: Emergency Medicine

## 2023-11-29 DIAGNOSIS — K819 Cholecystitis, unspecified: Secondary | ICD-10-CM | POA: Diagnosis not present

## 2023-11-29 HISTORY — PX: CHOLECYSTECTOMY: SHX55

## 2023-11-29 LAB — HCG, QUANTITATIVE, PREGNANCY: hCG, Beta Chain, Quant, S: 2545 m[IU]/mL — ABNORMAL HIGH (ref <5)

## 2023-11-29 LAB — POCT I-STAT, CHEM 8
BUN: 5 mg/dL — ABNORMAL LOW (ref 6–20)
Calcium, Ion: 1.09 mmol/L — ABNORMAL LOW (ref 1.15–1.40)
Chloride: 98 mmol/L (ref 98–111)
Creatinine, Ser: 0.8 mg/dL (ref 0.44–1.00)
Glucose, Bld: 91 mg/dL (ref 70–99)
HCT: 34 % — ABNORMAL LOW (ref 36.0–46.0)
Hemoglobin: 11.6 g/dL — ABNORMAL LOW (ref 12.0–15.0)
Potassium: 3 mmol/L — ABNORMAL LOW (ref 3.5–5.1)
Sodium: 137 mmol/L (ref 135–145)
TCO2: 23 mmol/L (ref 22–32)

## 2023-11-29 SURGERY — LAPAROSCOPIC CHOLECYSTECTOMY
Anesthesia: General

## 2023-11-29 MED ORDER — CHLORHEXIDINE GLUCONATE 0.12 % MT SOLN
OROMUCOSAL | Status: AC
  Administered 2023-11-29: 15 mL via OROMUCOSAL
  Filled 2023-11-29: qty 15

## 2023-11-29 MED ORDER — LABETALOL HCL 5 MG/ML IV SOLN
INTRAVENOUS | Status: DC | PRN
  Administered 2023-11-29: 5 mg via INTRAVENOUS

## 2023-11-29 MED ORDER — PHENYLEPHRINE 80 MCG/ML (10ML) SYRINGE FOR IV PUSH (FOR BLOOD PRESSURE SUPPORT)
PREFILLED_SYRINGE | INTRAVENOUS | Status: AC
  Filled 2023-11-29: qty 10

## 2023-11-29 MED ORDER — POTASSIUM CHLORIDE 10 MEQ/100ML IV SOLN
10.0000 meq | INTRAVENOUS | Status: AC
  Administered 2023-11-29 (×4): 10 meq via INTRAVENOUS
  Filled 2023-11-29 (×4): qty 100

## 2023-11-29 MED ORDER — LACTATED RINGERS IV SOLN: INTRAVENOUS | Status: DC | PRN

## 2023-11-29 MED ORDER — ONDANSETRON HCL 4 MG/2ML IJ SOLN: 4.0000 mg | Freq: Four times a day (QID) | INTRAMUSCULAR | Status: DC | PRN

## 2023-11-29 MED ORDER — DEXAMETHASONE SODIUM PHOSPHATE 10 MG/ML IJ SOLN
INTRAMUSCULAR | Status: AC
  Filled 2023-11-29: qty 1

## 2023-11-29 MED ORDER — ROCURONIUM BROMIDE 100 MG/10ML IV SOLN
INTRAVENOUS | Status: DC | PRN
  Administered 2023-11-29: 50 mg via INTRAVENOUS

## 2023-11-29 MED ORDER — OXYCODONE HCL 5 MG PO TABS
ORAL_TABLET | ORAL | Status: AC
  Filled 2023-11-29: qty 1

## 2023-11-29 MED ORDER — HYDROMORPHONE HCL 1 MG/ML IJ SOLN
0.5000 mg | Freq: Once | INTRAMUSCULAR | Status: AC
  Administered 2023-11-29: 0.5 mg via INTRAVENOUS
  Filled 2023-11-29: qty 1

## 2023-11-29 MED ORDER — DEXAMETHASONE SODIUM PHOSPHATE 10 MG/ML IJ SOLN
INTRAMUSCULAR | Status: DC | PRN
  Administered 2023-11-29: 10 mg via INTRAVENOUS

## 2023-11-29 MED ORDER — ACETAMINOPHEN 10 MG/ML IV SOLN
INTRAVENOUS | Status: AC
  Filled 2023-11-29: qty 100

## 2023-11-29 MED ORDER — SODIUM CHLORIDE 0.9 % IV SOLN
2.0000 g | Freq: Once | INTRAVENOUS | Status: AC
  Administered 2023-11-29: 2 g via INTRAVENOUS
  Filled 2023-11-29: qty 20

## 2023-11-29 MED ORDER — CHLORHEXIDINE GLUCONATE 0.12 % MT SOLN: 15.0000 mL | Freq: Once | OROMUCOSAL | Status: AC

## 2023-11-29 MED ORDER — HYDROMORPHONE HCL 1 MG/ML IJ SOLN
INTRAMUSCULAR | Status: AC
  Filled 2023-11-29: qty 1

## 2023-11-29 MED ORDER — HYDROMORPHONE HCL 1 MG/ML IJ SOLN
0.2500 mg | INTRAMUSCULAR | Status: DC | PRN
  Administered 2023-11-29 (×2): 0.5 mg via INTRAVENOUS

## 2023-11-29 MED ORDER — FENTANYL CITRATE (PF) 250 MCG/5ML IJ SOLN
INTRAMUSCULAR | Status: AC
  Filled 2023-11-29: qty 5

## 2023-11-29 MED ORDER — 0.9 % SODIUM CHLORIDE (POUR BTL) OPTIME
TOPICAL | Status: DC | PRN
  Administered 2023-11-29: 1000 mL

## 2023-11-29 MED ORDER — MIDAZOLAM HCL 2 MG/2ML IJ SOLN
INTRAMUSCULAR | Status: AC
  Filled 2023-11-29: qty 2

## 2023-11-29 MED ORDER — OXYCODONE HCL 5 MG PO TABS
5.0000 mg | ORAL_TABLET | Freq: Once | ORAL | Status: AC | PRN
  Administered 2023-11-29: 5 mg via ORAL

## 2023-11-29 MED ORDER — BUPIVACAINE-EPINEPHRINE 0.25% -1:200000 IJ SOLN
INTRAMUSCULAR | Status: DC | PRN
  Administered 2023-11-29: 30 mL

## 2023-11-29 MED ORDER — SODIUM CHLORIDE 0.9 % IR SOLN
Status: DC | PRN
  Administered 2023-11-29: 1000 mL

## 2023-11-29 MED ORDER — ONDANSETRON HCL 4 MG/2ML IJ SOLN
INTRAMUSCULAR | Status: DC | PRN
  Administered 2023-11-29: 4 mg via INTRAVENOUS

## 2023-11-29 MED ORDER — LIDOCAINE 2% (20 MG/ML) 5 ML SYRINGE
INTRAMUSCULAR | Status: AC
  Filled 2023-11-29: qty 5

## 2023-11-29 MED ORDER — ACETAMINOPHEN 10 MG/ML IV SOLN
INTRAVENOUS | Status: DC | PRN
  Administered 2023-11-29: 1000 mg via INTRAVENOUS

## 2023-11-29 MED ORDER — ORAL CARE MOUTH RINSE: 15.0000 mL | Freq: Once | OROMUCOSAL | Status: AC

## 2023-11-29 MED ORDER — KETOROLAC TROMETHAMINE 30 MG/ML IJ SOLN
INTRAMUSCULAR | Status: DC | PRN
  Administered 2023-11-29: 15 mg via INTRAVENOUS

## 2023-11-29 MED ORDER — IBUPROFEN 800 MG PO TABS: 800.0000 mg | ORAL_TABLET | Freq: Three times a day (TID) | ORAL | 0 refills | Status: DC | PRN

## 2023-11-29 MED ORDER — SODIUM CHLORIDE 0.9 % IV SOLN: INTRAVENOUS | Status: DC | PRN

## 2023-11-29 MED ORDER — MEPERIDINE HCL 25 MG/ML IJ SOLN: 6.2500 mg | INTRAMUSCULAR | Status: DC | PRN

## 2023-11-29 MED ORDER — PROPOFOL 10 MG/ML IV BOLUS
INTRAVENOUS | Status: DC | PRN
  Administered 2023-11-29: 150 mg via INTRAVENOUS

## 2023-11-29 MED ORDER — ROCURONIUM BROMIDE 10 MG/ML (PF) SYRINGE
PREFILLED_SYRINGE | INTRAVENOUS | Status: AC
  Filled 2023-11-29: qty 10

## 2023-11-29 MED ORDER — HYDROMORPHONE HCL 1 MG/ML IJ SOLN
1.0000 mg | INTRAMUSCULAR | Status: DC | PRN
  Administered 2023-11-29: 1 mg via INTRAVENOUS
  Administered 2023-11-29 (×2): 2 mg via INTRAVENOUS
  Filled 2023-11-29: qty 2
  Filled 2023-11-29: qty 1
  Filled 2023-11-29: qty 2

## 2023-11-29 MED ORDER — SODIUM CHLORIDE 0.9 % IV SOLN: 12.5000 mg | INTRAVENOUS | Status: DC | PRN

## 2023-11-29 MED ORDER — SUGAMMADEX SODIUM 200 MG/2ML IV SOLN
INTRAVENOUS | Status: DC | PRN
  Administered 2023-11-29: 200 mg via INTRAVENOUS

## 2023-11-29 MED ORDER — LIDOCAINE HCL (PF) 2 % IJ SOLN
INTRAMUSCULAR | Status: DC | PRN
  Administered 2023-11-29: 100 mg via INTRADERMAL

## 2023-11-29 MED ORDER — BUPIVACAINE-EPINEPHRINE (PF) 0.25% -1:200000 IJ SOLN
INTRAMUSCULAR | Status: AC
  Filled 2023-11-29: qty 30

## 2023-11-29 MED ORDER — ONDANSETRON HCL 4 MG/2ML IJ SOLN
INTRAMUSCULAR | Status: AC
  Filled 2023-11-29: qty 2

## 2023-11-29 MED ORDER — AMISULPRIDE (ANTIEMETIC) 5 MG/2ML IV SOLN: 10.0000 mg | Freq: Once | INTRAVENOUS | Status: DC | PRN

## 2023-11-29 MED ORDER — FENTANYL CITRATE (PF) 250 MCG/5ML IJ SOLN
INTRAMUSCULAR | Status: DC | PRN
  Administered 2023-11-29: 50 ug via INTRAVENOUS
  Administered 2023-11-29: 100 ug via INTRAVENOUS

## 2023-11-29 MED ORDER — KCL IN DEXTROSE-NACL 40-5-0.9 MEQ/L-%-% IV SOLN
INTRAVENOUS | Status: DC
  Filled 2023-11-29: qty 1000

## 2023-11-29 MED ORDER — OXYCODONE HCL 5 MG PO TABS: 5.0000 mg | ORAL_TABLET | Freq: Four times a day (QID) | ORAL | 0 refills | Status: DC | PRN

## 2023-11-29 MED ORDER — PROPOFOL 10 MG/ML IV BOLUS
INTRAVENOUS | Status: AC
  Filled 2023-11-29: qty 20

## 2023-11-29 MED ORDER — KETOROLAC TROMETHAMINE 30 MG/ML IJ SOLN
INTRAMUSCULAR | Status: AC
  Filled 2023-11-29: qty 1

## 2023-11-29 MED ORDER — OXYCODONE HCL 5 MG/5ML PO SOLN: 5.0000 mg | Freq: Once | ORAL | Status: AC | PRN

## 2023-11-29 MED ORDER — ONDANSETRON 4 MG PO TBDP: 4.0000 mg | ORAL_TABLET | Freq: Four times a day (QID) | ORAL | Status: DC | PRN

## 2023-11-29 SURGICAL SUPPLY — 37 items
BAG COUNTER SPONGE SURGICOUNT (BAG) ×1 IMPLANT
BLADE CLIPPER SURG (BLADE) IMPLANT
CANISTER SUCT 3000ML PPV (MISCELLANEOUS) ×1 IMPLANT
CHLORAPREP W/TINT 26 (MISCELLANEOUS) ×1 IMPLANT
CLIP LIGATING HEMO O LOK GREEN (MISCELLANEOUS) ×1 IMPLANT
CNTNR URN SCR LID CUP LEK RST (MISCELLANEOUS) IMPLANT
COVER SURGICAL LIGHT HANDLE (MISCELLANEOUS) ×1 IMPLANT
DERMABOND ADVANCED .7 DNX12 (GAUZE/BANDAGES/DRESSINGS) ×1 IMPLANT
ELECT REM PT RETURN 9FT ADLT (ELECTROSURGICAL) ×1
ELECTRODE REM PT RTRN 9FT ADLT (ELECTROSURGICAL) ×1 IMPLANT
GLOVE BIOGEL PI IND STRL 7.0 (GLOVE) ×1 IMPLANT
GLOVE SURG SS PI 7.0 STRL IVOR (GLOVE) ×1 IMPLANT
GOWN STRL REUS W/ TWL LRG LVL3 (GOWN DISPOSABLE) ×3 IMPLANT
GRASPER SUT TROCAR 14GX15 (MISCELLANEOUS) ×1 IMPLANT
IRRIG SUCT STRYKERFLOW 2 WTIP (MISCELLANEOUS) ×1
IRRIGATION SUCT STRKRFLW 2 WTP (MISCELLANEOUS) ×1 IMPLANT
KIT BASIN OR (CUSTOM PROCEDURE TRAY) ×1 IMPLANT
KIT IMAGING PINPOINTPAQ (MISCELLANEOUS) IMPLANT
KIT TURNOVER KIT B (KITS) ×1 IMPLANT
NDL 22X1.5 STRL (OR ONLY) (MISCELLANEOUS) ×1 IMPLANT
NEEDLE 22X1.5 STRL (OR ONLY) (MISCELLANEOUS) ×1 IMPLANT
NS IRRIG 1000ML POUR BTL (IV SOLUTION) ×1 IMPLANT
PAD ARMBOARD 7.5X6 YLW CONV (MISCELLANEOUS) ×1 IMPLANT
POUCH RETRIEVAL ECOSAC 10 (ENDOMECHANICALS) ×1 IMPLANT
SCISSORS LAP 5X35 DISP (ENDOMECHANICALS) ×1 IMPLANT
SET TUBE SMOKE EVAC HIGH FLOW (TUBING) ×1 IMPLANT
SLEEVE Z-THREAD 5X100MM (TROCAR) ×2 IMPLANT
SPECIMEN JAR SMALL (MISCELLANEOUS) ×1 IMPLANT
SUT MNCRL AB 4-0 PS2 18 (SUTURE) ×1 IMPLANT
SUT VICRYL 0 UR6 27IN ABS (SUTURE) IMPLANT
TOWEL GREEN STERILE (TOWEL DISPOSABLE) ×1 IMPLANT
TOWEL GREEN STERILE FF (TOWEL DISPOSABLE) ×1 IMPLANT
TRAY LAPAROSCOPIC MC (CUSTOM PROCEDURE TRAY) ×1 IMPLANT
TROCAR Z THREAD OPTICAL 12X100 (TROCAR) ×1 IMPLANT
TROCAR Z-THREAD OPTICAL 5X100M (TROCAR) ×1 IMPLANT
WARMER LAPAROSCOPE (MISCELLANEOUS) ×1 IMPLANT
WATER STERILE IRR 1000ML POUR (IV SOLUTION) ×1 IMPLANT

## 2023-11-29 NOTE — Anesthesia Procedure Notes (Signed)
Procedure Name: Intubation Date/Time: 11/29/2023 10:09 AM  Performed by: Alwyn Ren, CRNAPre-anesthesia Checklist: Patient identified, Emergency Drugs available, Suction available and Patient being monitored Patient Re-evaluated:Patient Re-evaluated prior to induction Oxygen Delivery Method: Circle system utilized Preoxygenation: Pre-oxygenation with 100% oxygen Induction Type: IV induction Ventilation: Mask ventilation without difficulty Laryngoscope Size: Miller and 2 Grade View: Grade II Tube type: Oral Tube size: 7.0 mm Number of attempts: 1 Airway Equipment and Method: Stylet and Oral airway Placement Confirmation: ETT inserted through vocal cords under direct vision, positive ETCO2 and breath sounds checked- equal and bilateral Secured at: 22 cm Tube secured with: Tape Dental Injury: Teeth and Oropharynx as per pre-operative assessment

## 2023-11-29 NOTE — Progress Notes (Signed)
Pre Procedure note for inpatients:   Leslie Henderson has been scheduled for Procedure(s): LAPAROSCOPIC CHOLECYSTECTOMY (N/A) today. The various methods of treatment have been discussed with the patient. After consideration of the risks, benefits and treatment options the patient has consented to the planned procedure.   The patient has been seen and labs reviewed. There are no changes in the patient's condition to prevent proceeding with the planned procedure today.  Recent labs:  Lab Results  Component Value Date   WBC 11.5 (H) 11/28/2023   HGB 11.9 (L) 11/28/2023   HCT 36.5 11/28/2023   PLT 304 11/28/2023   GLUCOSE 98 11/28/2023   ALT 24 11/28/2023   AST 22 11/28/2023   NA 135 11/28/2023   K 2.7 (LL) 11/28/2023   CL 97 (L) 11/28/2023   CREATININE 0.84 11/28/2023   BUN 5 (L) 11/28/2023   CO2 26 11/28/2023    Rodman Pickle, MD 11/29/2023 8:47 AM

## 2023-11-29 NOTE — H&P (Signed)
Leslie Henderson is an 27 y.o. female.   Chief Complaint: Cholecystitis HPI: Patient is a 27 year old female who comes in secondary to epigastric abdominal pain that radiates to the right upper quadrant.  Patient states that the pain began on Sunday.  She states that the pain was associate with nausea vomiting initially.  This subsequently subsided.  Patient was seen in the ER and underwent ultrasound.  Ultrasound showed cholelithiasis with thickening of the gallbladder wall positive Murphy sign.  Patient with leukocytosis of 11.5.  Patient has normal LFTs.  Patient denies any past abdominal surgeries.  General surgery was consulted for further evaluation and management.  Past Medical History:  Diagnosis Date   Asthma    Cholelithiasis without obstruction    Constipation    Environmental and seasonal allergies     History reviewed. No pertinent surgical history.  Family History  Problem Relation Age of Onset   Cholelithiasis Maternal Grandmother    Social History:  reports that she has never smoked. She has never used smokeless tobacco. She reports that she does not drink alcohol and does not use drugs.  Allergies: No Known Allergies  (Not in a hospital admission)   Results for orders placed or performed during the hospital encounter of 11/28/23 (from the past 48 hours)  CBC with Differential     Status: Abnormal   Collection Time: 11/28/23  7:25 PM  Result Value Ref Range   WBC 11.5 (H) 4.0 - 10.5 K/uL   RBC 4.46 3.87 - 5.11 MIL/uL   Hemoglobin 11.9 (L) 12.0 - 15.0 g/dL   HCT 16.1 09.6 - 04.5 %   MCV 81.8 80.0 - 100.0 fL   MCH 26.7 26.0 - 34.0 pg   MCHC 32.6 30.0 - 36.0 g/dL   RDW 40.9 81.1 - 91.4 %   Platelets 304 150 - 400 K/uL   nRBC 0.0 0.0 - 0.2 %   Neutrophils Relative % 70 %   Neutro Abs 8.0 (H) 1.7 - 7.7 K/uL   Lymphocytes Relative 19 %   Lymphs Abs 2.2 0.7 - 4.0 K/uL   Monocytes Relative 10 %   Monocytes Absolute 1.2 (H) 0.1 - 1.0 K/uL   Eosinophils Relative  1 %   Eosinophils Absolute 0.1 0.0 - 0.5 K/uL   Basophils Relative 0 %   Basophils Absolute 0.0 0.0 - 0.1 K/uL   Immature Granulocytes 0 %   Abs Immature Granulocytes 0.04 0.00 - 0.07 K/uL    Comment: Performed at [facility], [address], [place], [place] [address]  Comprehensive metabolic panel     Status: Abnormal   Collection Time: 11/28/23  7:25 PM  Result Value Ref Range   Sodium 135 135 - 145 mmol/L   Potassium 2.7 (LL) 3.5 - 5.1 mmol/L    Comment: CRITICAL RESULT CALLED TO, READ BACK BY AND VERIFIED WITH B SANGLANG RN 11/28/2023 2015 BNUNNERY   Chloride 97 (L) 98 - 111 mmol/L   CO2 26 22 - 32 mmol/L   Glucose, Bld 98 70 - 99 mg/dL    Comment: Glucose reference range applies only to samples taken after fasting for at least 8 hours.   BUN 5 (L) 6 - 20 mg/dL   Creatinine, Ser 6.21 0.44 - 1.00 mg/dL   Calcium 9.0 8.9 - 30.8 mg/dL   Total Protein 7.5 6.5 - 8.1 g/dL   Albumin 3.3 (L) 3.5 - 5.0 g/dL   AST 22 15 - 41 U/L   ALT 24 0 -  44 U/L   Alkaline Phosphatase 58 38 - 126 U/L   Total Bilirubin 0.7 <1.2 mg/dL   GFR, Estimated >95 >63 mL/min    Comment: (NOTE) Calculated using the CKD-EPI Creatinine Equation (2021)    Anion gap 12 5 - 15    Comment: Performed at [facility], [address] [place], [place] [address]  Lipase, blood     Status: None   Collection Time: 11/28/23  7:25 PM  Result Value Ref Range   Lipase 21 11 - 51 U/L    Comment: Performed at [facility], [address] [place], [place] [address]   US Abdomen Limited Result Date: 11/28/2023 CLINICAL DATA:  Right upper quadrant abdominal pain EXAM: ULTRASOUND ABDOMEN LIMITED RIGHT UPPER QUADRANT COMPARISON:  11/26/2023 FINDINGS: Gallbladder: Gallstones: Present Sludge: None Gallbladder Wall: Thickened measuring up to 12 mm Pericholecystic fluid: None Sonographic Murphy's Sign: Positive per technologist Common bile duct: Diameter: 4 mm Liver: Parenchymal echogenicity: Within normal limits  Contours: Normal Lesions: None Portal vein: Patent.  Hepatopetal flow Other: None. IMPRESSION: Cholelithiasis with gallbladder wall thickening and positive sonographic Murphy sign is suspicious for cholecystitis. Electronically Signed   By: Acquanetta Belling M.D.   On: 11/28/2023 20:48    Review of Systems  Constitutional:  Negative for chills and fever.  HENT:  Negative for ear discharge, hearing loss and sore throat.   Eyes:  Negative for discharge.  Respiratory:  Negative for cough and shortness of breath.   Cardiovascular:  Negative for chest pain and leg swelling.  Gastrointestinal:  Positive for abdominal pain, nausea and vomiting. Negative for constipation and diarrhea.  Musculoskeletal:  Negative for myalgias and neck pain.  Skin:  Negative for rash.  Allergic/Immunologic: Negative for environmental allergies.  Neurological:  Negative for dizziness and seizures.  Hematological:  Does not bruise/bleed easily.  Psychiatric/Behavioral:  Negative for suicidal ideas.   All other systems reviewed and are negative.   Blood pressure 106/88, pulse (!) 106, temperature 99.4 F (37.4 C), temperature source Oral, resp. rate 16, height 5\' 3"  (1.6 m), weight 108.9 kg, last menstrual period 10/07/2023, SpO2 93%. Physical Exam Constitutional:      Appearance: She is well-developed.     Comments: Conversant No acute distress  HENT:     Head: Normocephalic and atraumatic.  Eyes:     General: Lids are normal. No scleral icterus.    Pupils: Pupils are equal, round, and reactive to light.     Comments: Pupils are equal round and reactive No lid lag Moist conjunctiva  Neck:     Thyroid: No thyromegaly.     Trachea: No tracheal tenderness.     Comments: No cervical lymphadenopathy Cardiovascular:     Rate and Rhythm: Normal rate and regular rhythm.     Heart sounds: No murmur heard. Pulmonary:     Effort: Pulmonary effort is normal.     Breath sounds: Normal breath sounds. No wheezing or  rales.  Abdominal:     Tenderness: There is abdominal tenderness in the epigastric area.     Hernia: No hernia is present.  Musculoskeletal:     Cervical back: Normal range of motion and neck supple.  Skin:    General: Skin is warm.     Findings: No rash.     Nails: There is no clubbing.     Comments: Normal skin turgor  Neurological:     Mental Status: She is alert and oriented to person, place, and time.  Comments: Normal gait and station  Psychiatric:        Mood and Affect: Mood normal.        Thought Content: Thought content normal.        Judgment: Judgment normal.     Comments: Appropriate affect      Assessment/Plan 27 year old female with acute cholecystitis. 1.  Will admit the patient for IV antibiotics, continue with n.p.o. 2.  Patient will need lap chole by Dr. Sheliah Hatch today. 3. All risks and benefits were discussed with the patient to generally include: infection, bleeding, possible need for post op ERCP, damage to the bile ducts, and bile leak. Alternatives were offered and described.  All questions were answered and the patient voiced understanding of the procedure and wishes to proceed at this point with a laparoscopic cholecystectomy   Axel Filler, MD 11/29/2023, 2:34 AM

## 2023-11-29 NOTE — ED Provider Notes (Signed)
[facility] Emergency Department Provider Note MRN:  [id number]  Arrival date & time: 11/29/23     Chief Complaint   Abdominal Pain   History of Present Illness   Leslie Henderson is a 27 y.o. year-old female presents to the ED with chief complaint of RUQ abdominal pain.  Has known gallstones.  Had recent medical abortion.  Presenting tonight with worsening RUQ pain and subjective fever.  She hasn't had vomiting, but states she hasn't eaten because of the pain.  She rates the pain as severe.  History provided by patient.   Review of Systems  Pertinent positive and negative review of systems noted in HPI.    Physical Exam   Vitals:   11/29/23 0316 11/29/23 0327  BP:    Pulse:    Resp:    Temp: 98.5 F (36.9 C) 97.7 F (36.5 C)  SpO2:      CONSTITUTIONAL:  uncomfortable-appearing, NAD NEURO:  Alert and oriented x 3, CN 3-12 grossly intact EYES:  eyes equal and reactive ENT/NECK:  Supple, no stridor  CARDIO:  tachycardic, appears well-perfused  PULM:  No respiratory distress, RUQ TTP GI/GU:  non-distended MSK/SPINE:  No gross deformities, no edema, moves all extremities  SKIN:  no rash, atraumatic   *Additional and/or pertinent findings included in MDM below  Diagnostic and Interventional Summary    EKG Interpretation Date/Time:    Ventricular Rate:    PR Interval:    QRS Duration:    QT Interval:    QTC Calculation:   R Axis:      Text Interpretation:         Labs Reviewed  CBC WITH DIFFERENTIAL/PLATELET - Abnormal; Notable for the following components:      Result Value   WBC 11.5 (*)    Hemoglobin 11.9 (*)    Neutro Abs 8.0 (*)    Monocytes Absolute 1.2 (*)    All other components within normal limits  COMPREHENSIVE METABOLIC PANEL - Abnormal; Notable for the following components:   Potassium 2.7 (*)    Chloride 97 (*)    BUN 5 (*)    Albumin 3.3 (*)    All other components within normal limits  HCG, QUANTITATIVE,  PREGNANCY - Abnormal; Notable for the following components:   hCG, Beta Chain, Quant, S 2,545 (*)    All other components within normal limits  LIPASE, BLOOD    US Abdomen Limited  Final Result      Medications  potassium chloride 10 mEq in 100 mL IVPB (10 mEq Intravenous New Bag/Given 11/29/23 0326)  dextrose 5 % and 0.9 % NaCl with KCl 40 mEq/L infusion (has no administration in time range)  HYDROmorphone (DILAUDID) injection 1-2 mg (2 mg Intravenous Given 11/29/23 0328)  ondansetron (ZOFRAN-ODT) disintegrating tablet 4 mg (has no administration in time range)    Or  ondansetron (ZOFRAN) injection 4 mg (has no administration in time range)  fentaNYL (SUBLIMAZE) injection 50 mcg (50 mcg Intramuscular Given 11/28/23 1930)  cefTRIAXone (ROCEPHIN) 2 g in sodium chloride 0.9 % 100 mL IVPB (0 g Intravenous Stopped 11/29/23 0246)  HYDROmorphone (DILAUDID) injection 0.5 mg (0.5 mg Intravenous Given 11/29/23 0159)     Procedures  /  Critical Care Procedures  ED Course and Medical Decision Making  I have reviewed the triage vital signs, the nursing notes, and pertinent available records from the EMR.  Social Determinants Affecting Complexity of Care: Patient has no clinically significant social determinants affecting this chief complaint.Marland Kitchen  ED Course: Clinical Course as of 11/29/23 0402  Wed Nov 29, 2023  0220 Comprehensive metabolic panel(!!) Hypokalemia, will supplement with IV K [RB]  0220 CBC with Differential(!) Mild leukocytosis [RB]  0221 Lipase, blood Normal lipase, doubt pancreatitis [RB]  0221 Comprehensive metabolic panel(!!) Normal LFTs [RB]  0402 hCG, quantitative, pregnancy(!) Downtrending, consistent with recent medical abortion [RB]    Clinical Course User Index [RB] Roxy Horseman, PA-C    Medical Decision Making Patient here with known gallstones.  Seen recently for the same.  Still having severe RUQ pain.    Repeat US worrisome for cholecystitis.   Will consult with general surgery.    Patient had recent medical abortion.  Will recheck HCG to ensure down trending.  Amount and/or Complexity of Data Reviewed Labs: ordered. Decision-making details documented in ED Course.  Risk Prescription drug management. Decision regarding hospitalization.         Consultants: I consulted with Dr. Derrell Lolling, who is appreciated for admitting.    Treatment and Plan: Patient's exam and diagnostic results are concerning for cholecystitis.  Feel that patient will need admission to the hospital for further treatment and evaluation.  Patient discussed with attending physician, Dr. Bebe Shaggy, who agrees with plan.  Final Clinical Impressions(s) / ED Diagnoses     ICD-10-CM   1. Cholecystitis  K81.9       ED Discharge Orders     None         Discharge Instructions Discussed with and Provided to Patient:   Discharge Instructions   None      Roxy Horseman, PA-C 11/29/23 0402    Zadie Rhine, MD 11/29/23 [phone]

## 2023-11-29 NOTE — Anesthesia Preprocedure Evaluation (Signed)
Anesthesia Evaluation  Patient identified by MRN, date of birth, ID band Patient awake    Reviewed: Allergy & Precautions, H&P , NPO status , Patient's Chart, lab work & pertinent test results  Airway Mallampati: II  TM Distance: >3 FB Neck ROM: Full    Dental no notable dental hx.    Pulmonary asthma    Pulmonary exam normal breath sounds clear to auscultation       Cardiovascular negative cardio ROS Normal cardiovascular exam Rhythm:Regular Rate:Normal     Neuro/Psych negative neurological ROS  negative psych ROS   GI/Hepatic negative GI ROS, Neg liver ROS,,,  Endo/Other    Class 3 obesity  Renal/GU negative Renal ROS  negative genitourinary   Musculoskeletal negative musculoskeletal ROS (+)    Abdominal  (+) + obese  Peds negative pediatric ROS (+)  Hematology negative hematology ROS (+)   Anesthesia Other Findings   Reproductive/Obstetrics negative OB ROS                             Anesthesia Physical Anesthesia Plan  ASA: 3  Anesthesia Plan: General   Post-op Pain Management: Dilaudid IV and Minimal or no pain anticipated   Induction: Intravenous  PONV Risk Score and Plan: 3 and Ondansetron, Dexamethasone, Midazolam and Treatment may vary due to age or medical condition  Airway Management Planned: Oral ETT  Additional Equipment:   Intra-op Plan:   Post-operative Plan: Extubation in OR  Informed Consent: I have reviewed the patients History and Physical, chart, labs and discussed the procedure including the risks, benefits and alternatives for the proposed anesthesia with the patient or authorized representative who has indicated his/her understanding and acceptance.     Dental advisory given  Plan Discussed with: CRNA  Anesthesia Plan Comments:        Anesthesia Quick Evaluation

## 2023-11-29 NOTE — Anesthesia Postprocedure Evaluation (Signed)
Anesthesia Post Note  Patient: Leslie Henderson  Procedure(s) Performed: LAPAROSCOPIC CHOLECYSTECTOMY     Patient location during evaluation: PACU Anesthesia Type: General Level of consciousness: awake and alert Pain management: pain level controlled Vital Signs Assessment: post-procedure vital signs reviewed and stable Respiratory status: spontaneous breathing, nonlabored ventilation and respiratory function stable Cardiovascular status: blood pressure returned to baseline and stable Postop Assessment: no apparent nausea or vomiting Anesthetic complications: no   No notable events documented.  Last Vitals:  Vitals:   11/29/23 1215 11/29/23 1230  BP: 129/83 130/88  Pulse: 86 88  Resp: 15 16  Temp:    SpO2: 94% 95%    Last Pain:  Vitals:   11/29/23 1230  TempSrc:   PainSc: Asleep                 Lowella Curb

## 2023-11-29 NOTE — Op Note (Signed)
PATIENT:  Leslie Henderson  27 y.o. female  PRE-OPERATIVE DIAGNOSIS:  cholecystitis  POST-OPERATIVE DIAGNOSIS:  cholecystitis  PROCEDURE:  Procedure(s): LAPAROSCOPIC CHOLECYSTECTOMY   SURGEON:  Kellene Mccleary, De Blanch, MD   ASSISTANT: none  ANESTHESIA:   local and general  Indications for procedure: [name] is a 27 y.o. female with symptoms of Nausea and vomiting consistent with gallbladder disease, Confirmed by ultrasound.  Description of procedure: The patient was brought into the operative suite, placed supine. Anesthesia was administered with endotracheal tube. Patient was strapped in place and foot board was secured. All pressure points were offloaded by foam padding. The patient was prepped and draped in the usual sterile fashion.  A periumbilical incision was made and optical entry was used to enter the abdomen. 2 5 mm trocars were placed on in the right lateral space on in the right subcostal space. A 12mm trocar was placed in the subxiphoid space. Marcaine was infused to the subxiphoid space and lateral upper right abdomen in the transversus abdominis plane. Next the patient was placed in reverse trendelenberg. The gallbladder appeareddilated and acutely inflamed.   The gallbladder was retracted cephalad and lateral. The peritoneum was reflected off the infundibulum working lateral to medial. The cystic duct and cystic artery were identified and further dissection revealed a critical view. The cystic duct and cystic artery were doubly clipped and ligated.   The gallbladder was removed off the liver bed with cautery. The Gallbladder was placed in a specimen bag. In order to remove the gallbladder, in the incision had to be enlarged and the fascia was sharply incised. The gallbladder fossa was irrigated and hemostasis was applied with cautery. The gallbladder was removed via the 12mm trocar. The fascial defect was closed with running 0 vicryl suture.  Pneumoperitoneum was  removed, all trocar were removed. All incisions were closed with 4-0 monocryl subcuticular stitch. The patient woke from anesthesia and was brought to PACU in stable condition. All counts were correct  Findings: acute cholecystitis  Specimen: gallbladder  Blood loss: 50 ml  Local anesthesia: 30 ml Marcaine  Complications: none  PLAN OF CARE: Discharge to home after PACU  PATIENT DISPOSITION:  PACU - hemodynamically stable.  De Blanch [facility], [place]

## 2023-11-29 NOTE — ED Notes (Signed)
Report called to OR  

## 2023-11-29 NOTE — Progress Notes (Signed)
  [facility] Pre-op Review 1.Surgical orders: Consent orders: Orders in Epic Consent signed: Patient alert and oriented: Antibiotic: Rocephin 0203 Pre-meds:  2.  Pre-procedure checklist completed:   In ED  3.  NPO:  4.  CHG Bath completed:  Needs to be completed      Belongings removed and placed in clean gown:  5.  Labs Performed: CBC    Abnormal 11/29/23 CMP/BMP   Abnormal 11/29/23 Critical KCL 2.7 Runs of Potassium given  PT/INR HA1C Type and Screen Surgical PCR:  Pregnancy:  Positive 11/29/23 EKG:    11/29/23 Chest x-ray:   6.  Recent H&P or progress note if inpatient:  11/29/23  7.  Language Barrier:    8.  Vital Signs:  WNL Oxygen:  RA Tele:   Yes Can the patient travel without Tele  9.  Medications: Cardiac Drips:  Pain Medications: Dilaudid 2mg  IV 0615 Beta Blocker:  Anticoagulants:  GLP1:  10. IV access:  20 Right AC

## 2023-11-29 NOTE — Transfer of Care (Signed)
Immediate Anesthesia Transfer of Care Note  Patient: Leslie Henderson  Procedure(s) Performed: LAPAROSCOPIC CHOLECYSTECTOMY  Patient Location: PACU  Anesthesia Type:General  Level of Consciousness: awake, alert , and oriented  Airway & Oxygen Therapy: Patient Spontanous Breathing and Patient connected to face mask oxygen  Post-op Assessment: Report given to RN and Post -op Vital signs reviewed and stable  Post vital signs: Reviewed and stable  Last Vitals:  Vitals Value Taken Time  BP 149/98 11/29/23 1126  Temp    Pulse 99 11/29/23 1128  Resp 12 11/29/23 1128  SpO2 98 % 11/29/23 1128  Vitals shown include unfiled device data.  Last Pain:  Vitals:   11/29/23 0901  TempSrc: Oral  PainSc:          Complications: No notable events documented.

## 2023-11-29 NOTE — ED Notes (Signed)
ED TO INPATIENT HANDOFF REPORT  ED Nurse Name and Phone #: Rodney Booze [phone]  S Name/Age/Gender Leslie Henderson 27 y.o. female Room/Bed: 021C/021C  Code Status   Code Status: Full Code  Home/SNF/Other Home Patient oriented to: self, place, time, and situation Is this baseline? No   Triage Complete: Triage complete  Chief Complaint Cholecystitis [K81.9]  Triage Note Pt here for RUQ abd pain states that she was seen on Sunday for her gallbladder and is supposed to have surgery but has not yet and cannot be seen in office until January. Was supposed to go for appointment today but did not arrive on time and was not able to be seen. Coming in today for uncontrolled pain.    Allergies No Known Allergies  Level of Care/Admitting Diagnosis ED Disposition     ED Disposition  Admit   Condition  --   Comment  Hospital Area: [facility] [[id number]]  Level of Care: Med-Surg [16]  May place patient in observation at [facility] or [facility] if equivalent level of care is available:: No  Covid Evaluation: Asymptomatic - no recent exposure (last 10 days) testing not required  Diagnosis: Cholecystitis [098119]  Admitting Physician: CCS, MD [[id number]]  Attending Physician: CCS, MD [[id number]]          B Medical/Surgery History Past Medical History:  Diagnosis Date   Asthma    Cholelithiasis without obstruction    Constipation    Environmental and seasonal allergies    History reviewed. No pertinent surgical history.   A IV Location/Drains/Wounds Patient Lines/Drains/Airways Status     Active Line/Drains/Airways     Name Placement date Placement time Site Days   Peripheral IV 11/29/23 20 G Right Antecubital 11/29/23  0146  Antecubital  less than 1            Intake/Output Last 24 hours  Intake/Output Summary (Last 24 hours) at 11/29/2023 0304 Last data filed at 11/29/2023 0246 Gross per 24 hour  Intake 103.13 ml  Output --  Net 103.13 ml     Labs/Imaging Results for orders placed or performed during the hospital encounter of 11/28/23 (from the past 48 hours)  CBC with Differential     Status: Abnormal   Collection Time: 11/28/23  7:25 PM  Result Value Ref Range   WBC 11.5 (H) 4.0 - 10.5 K/uL   RBC 4.46 3.87 - 5.11 MIL/uL   Hemoglobin 11.9 (L) 12.0 - 15.0 g/dL   HCT 14.7 82.9 - 56.2 %   MCV 81.8 80.0 - 100.0 fL   MCH 26.7 26.0 - 34.0 pg   MCHC 32.6 30.0 - 36.0 g/dL   RDW 13.0 86.5 - 78.4 %   Platelets 304 150 - 400 K/uL   nRBC 0.0 0.0 - 0.2 %   Neutrophils Relative % 70 %   Neutro Abs 8.0 (H) 1.7 - 7.7 K/uL   Lymphocytes Relative 19 %   Lymphs Abs 2.2 0.7 - 4.0 K/uL   Monocytes Relative 10 %   Monocytes Absolute 1.2 (H) 0.1 - 1.0 K/uL   Eosinophils Relative 1 %   Eosinophils Absolute 0.1 0.0 - 0.5 K/uL   Basophils Relative 0 %   Basophils Absolute 0.0 0.0 - 0.1 K/uL   Immature Granulocytes 0 %   Abs Immature Granulocytes 0.04 0.00 - 0.07 K/uL    Comment: Performed at [facility], [address], [place], [place] [address]  Comprehensive metabolic panel     Status: Abnormal  Collection Time: 11/28/23  7:25 PM  Result Value Ref Range   Sodium 135 135 - 145 mmol/L   Potassium 2.7 (LL) 3.5 - 5.1 mmol/L    Comment: CRITICAL RESULT CALLED TO, READ BACK BY AND VERIFIED WITH B SANGLANG RN 11/28/2023 2015 BNUNNERY   Chloride 97 (L) 98 - 111 mmol/L   CO2 26 22 - 32 mmol/L   Glucose, Bld 98 70 - 99 mg/dL    Comment: Glucose reference range applies only to samples taken after fasting for at least 8 hours.   BUN 5 (L) 6 - 20 mg/dL   Creatinine, Ser 1.61 0.44 - 1.00 mg/dL   Calcium 9.0 8.9 - 09.6 mg/dL   Total Protein 7.5 6.5 - 8.1 g/dL   Albumin 3.3 (L) 3.5 - 5.0 g/dL   AST 22 15 - 41 U/L   ALT 24 0 - 44 U/L   Alkaline Phosphatase 58 38 - 126 U/L   Total Bilirubin 0.7 <1.2 mg/dL   GFR, Estimated >04 >54 mL/min    Comment: (NOTE) Calculated using the CKD-EPI Creatinine Equation (2021)    Anion gap 12 5 -  15    Comment: Performed at [facility], [address], [place], [place] [address]  Lipase, blood     Status: None   Collection Time: 11/28/23  7:25 PM  Result Value Ref Range   Lipase 21 11 - 51 U/L    Comment: Performed at [facility], [address] [place], [place] [address]  hCG, quantitative, pregnancy     Status: Abnormal   Collection Time: 11/29/23  1:50 AM  Result Value Ref Range   hCG, Beta Chain, Quant, S 2,545 (H) <5 mIU/mL    Comment:          GEST. AGE      CONC.  (mIU/mL)   <=1 WEEK        5 - 50     2 WEEKS       50 - 500     3 WEEKS       100 - 10,000     4 WEEKS     1,000 - 30,000     5 WEEKS     3,500 - 115,000   6-8 WEEKS     12,000 - 270,000    12 WEEKS     15,000 - 220,000        FEMALE AND NON-PREGNANT FEMALE:     LESS THAN 5 mIU/mL Performed at [facility], [address] [place], [place] [address]    US Abdomen Limited Result Date: 11/28/2023 CLINICAL DATA:  Right upper quadrant abdominal pain EXAM: ULTRASOUND ABDOMEN LIMITED RIGHT UPPER QUADRANT COMPARISON:  11/26/2023 FINDINGS: Gallbladder: Gallstones: Present Sludge: None Gallbladder Wall: Thickened measuring up to 12 mm Pericholecystic fluid: None Sonographic Murphy's Sign: Positive per technologist Common bile duct: Diameter: 4 mm Liver: Parenchymal echogenicity: Within normal limits Contours: Normal Lesions: None Portal vein: Patent.  Hepatopetal flow Other: None. IMPRESSION: Cholelithiasis with gallbladder wall thickening and positive sonographic Murphy sign is suspicious for cholecystitis. Electronically Signed   By: Acquanetta Belling M.D.   On: 11/28/2023 20:48    Pending Labs Unresulted Labs (From admission, onward)    None       Vitals/Pain Today's Vitals   11/28/23 1806 11/28/23 2124 11/29/23 0206 11/29/23 0246  BP:  106/88    Pulse:  (!) 106    Resp:  16  Temp:      TempSrc:      SpO2:  93%    Weight: 108.9 kg     Height: 5\' 3"  (1.6 m)     PainSc: 10-Worst pain  ever  10-Worst pain ever 10-Worst pain ever    Isolation Precautions No active isolations  Medications Medications  potassium chloride 10 mEq in 100 mL IVPB (10 mEq Intravenous New Bag/Given 11/29/23 0202)  dextrose 5 % and 0.9 % NaCl with KCl 40 mEq/L infusion (has no administration in time range)  HYDROmorphone (DILAUDID) injection 1-2 mg (has no administration in time range)  ondansetron (ZOFRAN-ODT) disintegrating tablet 4 mg (has no administration in time range)    Or  ondansetron (ZOFRAN) injection 4 mg (has no administration in time range)  fentaNYL (SUBLIMAZE) injection 50 mcg (50 mcg Intramuscular Given 11/28/23 1930)  cefTRIAXone (ROCEPHIN) 2 g in sodium chloride 0.9 % 100 mL IVPB (0 g Intravenous Stopped 11/29/23 0246)  HYDROmorphone (DILAUDID) injection 0.5 mg (0.5 mg Intravenous Given 11/29/23 0159)    Mobility walks     Focused Assessments Cardiac Assessment Handoff:    No results found for: "CKTOTAL", "CKMB", "CKMBINDEX", "TROPONINI" No results found for: "DDIMER" Does the Patient currently have chest pain? No    R Recommendations: See Admitting Provider Note  Report given to:   Additional Notes:

## 2023-11-30 ENCOUNTER — Other Ambulatory Visit: Payer: Self-pay

## 2023-11-30 ENCOUNTER — Encounter (HOSPITAL_COMMUNITY): Payer: Self-pay | Admitting: General Surgery

## 2023-11-30 ENCOUNTER — Telehealth: Payer: Self-pay

## 2023-11-30 LAB — SURGICAL PATHOLOGY

## 2023-11-30 NOTE — Telephone Encounter (Signed)
Called patient to cancel NOB appointment. Vm not available. Will try to call patient again today. Zhoe Catania l Caralee Morea, CMA

## 2023-12-08 ENCOUNTER — Ambulatory Visit: Payer: Medicaid Other

## 2024-02-20 ENCOUNTER — Inpatient Hospital Stay (HOSPITAL_COMMUNITY)
Admission: AD | Admit: 2024-02-20 | Discharge: 2024-02-20 | Disposition: A | Discharge status: Home / Self Care | Attending: Obstetrics & Gynecology | Admitting: Obstetrics & Gynecology

## 2024-02-20 ENCOUNTER — Encounter (HOSPITAL_COMMUNITY): Payer: Self-pay | Admitting: *Deleted

## 2024-02-20 DIAGNOSIS — R1013 Epigastric pain: Secondary | ICD-10-CM

## 2024-02-20 DIAGNOSIS — K2100 Gastro-esophageal reflux disease with esophagitis, without bleeding: Secondary | ICD-10-CM

## 2024-02-20 DIAGNOSIS — Z3A01 Less than 8 weeks gestation of pregnancy: Secondary | ICD-10-CM | POA: Insufficient documentation

## 2024-02-20 DIAGNOSIS — R079 Chest pain, unspecified: Secondary | ICD-10-CM | POA: Diagnosis present

## 2024-02-20 DIAGNOSIS — O26891 Other specified pregnancy related conditions, first trimester: Secondary | ICD-10-CM | POA: Insufficient documentation

## 2024-02-20 DIAGNOSIS — Z3490 Encounter for supervision of normal pregnancy, unspecified, unspecified trimester: Secondary | ICD-10-CM

## 2024-02-20 DIAGNOSIS — Z9049 Acquired absence of other specified parts of digestive tract: Secondary | ICD-10-CM

## 2024-02-20 LAB — COMPREHENSIVE METABOLIC PANEL
ALT: 19 U/L (ref 0–44)
AST: 21 U/L (ref 15–41)
Albumin: 3.4 g/dL — ABNORMAL LOW (ref 3.5–5.0)
Alkaline Phosphatase: 47 U/L (ref 38–126)
Anion gap: 7 (ref 5–15)
BUN: 10 mg/dL (ref 6–20)
CO2: 23 mmol/L (ref 22–32)
Calcium: 8.9 mg/dL (ref 8.9–10.3)
Chloride: 104 mmol/L (ref 98–111)
Creatinine, Ser: 0.82 mg/dL (ref 0.44–1.00)
GFR, Estimated: >60 mL/min (ref >60)
Glucose, Bld: 81 mg/dL (ref 70–99)
Potassium: 3.5 mmol/L (ref 3.5–5.1)
Sodium: 134 mmol/L — ABNORMAL LOW (ref 135–145)
Total Bilirubin: 0.5 mg/dL (ref 0.0–1.2)
Total Protein: 7.2 g/dL (ref 6.5–8.1)

## 2024-02-20 LAB — CBC
HCT: 38.2 % (ref 36.0–46.0)
Hemoglobin: 12.5 g/dL (ref 12.0–15.0)
MCH: 26.4 pg (ref 26.0–34.0)
MCHC: 32.7 g/dL (ref 30.0–36.0)
MCV: 80.8 fL (ref 80.0–100.0)
Platelets: 257 10*3/uL (ref 150–400)
RBC: 4.73 MIL/uL (ref 3.87–5.11)
RDW: 13.8 % (ref 11.5–15.5)
WBC: 5.7 10*3/uL (ref 4.0–10.5)
nRBC: 0 % (ref 0.0–0.2)

## 2024-02-20 LAB — URINALYSIS, ROUTINE W REFLEX MICROSCOPIC
Bacteria, UA: NONE SEEN
Bilirubin Urine: NEGATIVE
Glucose, UA: NEGATIVE mg/dL
Hgb urine dipstick: NEGATIVE
Ketones, ur: NEGATIVE mg/dL
Nitrite: NEGATIVE
Protein, ur: NEGATIVE mg/dL
Specific Gravity, Urine: 1.025 (ref 1.005–1.030)
pH: 5 (ref 5.0–8.0)

## 2024-02-20 LAB — TSH: TSH: 1.852 u[IU]/mL (ref 0.350–4.500)

## 2024-02-20 LAB — HCG, QUANTITATIVE, PREGNANCY: hCG, Beta Chain, Quant, S: 25205 m[IU]/mL — ABNORMAL HIGH (ref <5)

## 2024-02-20 LAB — LIPASE, BLOOD: Lipase: 23 U/L (ref 11–51)

## 2024-02-20 LAB — POCT PREGNANCY, URINE: Preg Test, Ur: POSITIVE — AB

## 2024-02-20 LAB — BRAIN NATRIURETIC PEPTIDE: B Natriuretic Peptide: 5.8 pg/mL (ref 0.0–100.0)

## 2024-02-20 MED ORDER — FAMOTIDINE 20 MG PO TABS: 20.0000 mg | ORAL_TABLET | Freq: Two times a day (BID) | ORAL | 3 refills | Status: AC

## 2024-02-20 MED ORDER — SUCRALFATE 1 GM/10ML PO SUSP
1.0000 g | Freq: Three times a day (TID) | ORAL | Status: DC
  Administered 2024-02-20: 1 g via ORAL
  Filled 2024-02-20 (×3): qty 10

## 2024-02-20 MED ORDER — SUCRALFATE 1 GM/10ML PO SUSP: 1.0000 g | Freq: Three times a day (TID) | ORAL | 0 refills | Status: DC

## 2024-02-20 MED ORDER — ONDANSETRON HCL 4 MG PO TABS: 4.0000 mg | ORAL_TABLET | Freq: Three times a day (TID) | ORAL | 0 refills | Status: DC | PRN

## 2024-02-20 MED ORDER — FAMOTIDINE 20 MG PO TABS
20.0000 mg | ORAL_TABLET | Freq: Once | ORAL | Status: AC
  Administered 2024-02-20: 20 mg via ORAL
  Filled 2024-02-20: qty 1

## 2024-02-20 NOTE — MAU Provider Note (Signed)
 History     CSN: [id number]  Arrival date and time: 02/20/24 1326   Event Date/Time   First Provider Initiated Contact with Patient 02/20/24 1524      Chief Complaint  Patient presents with  . Abdominal Pain   HPI Patient is 28 y.o. [id number] [redacted]w[redacted]d here with complaints of RUQ pain/midepigastric .she reports it feels like her pain prior   +FM, denies LOF, VB, contractions, vaginal discharge.   OB History     Gravida  4   Para  2   Term  2   Preterm      AB  1   Living  2      SAB      IAB  1   Ectopic      Multiple      Live Births              Past Medical History:  Diagnosis Date  . Asthma   . Cholelithiasis without obstruction   . Constipation   . Environmental and seasonal allergies     Past Surgical History:  Procedure Laterality Date  . CHOLECYSTECTOMY N/A 11/29/2023   Procedure: LAPAROSCOPIC CHOLECYSTECTOMY;  Surgeon: Kinsinger, De Blanch, MD;  Location: [facility] OR;  Service: General;  Laterality: N/A;    Family History  Problem Relation Age of Onset  . Cholelithiasis Maternal Grandmother     Social History   Tobacco Use  . Smoking status: Never  . Smokeless tobacco: Never  Substance Use Topics  . Alcohol use: No  . Drug use: No    Allergies: No Known Allergies  Medications Prior to Admission  Medication Sig Dispense Refill Last Dose/Taking  . ondansetron (ZOFRAN) 4 MG tablet Take 1 tablet (4 mg total) by mouth every 8 (eight) hours as needed for nausea or vomiting. 20 tablet 0 02/20/2024  . Prenatal Vit-Fe Fumarate-FA (MULTIVITAMIN-PRENATAL) 27-0.8 MG TABS tablet Take 1 tablet by mouth daily at 12 noon.   02/20/2024  . ibuprofen (ADVIL) 800 MG tablet Take 1 tablet (800 mg total) by mouth every 8 (eight) hours as needed. 30 tablet 0   . ondansetron (ZOFRAN-ODT) 4 MG disintegrating tablet Take 1 tablet (4 mg total) by mouth every 8 (eight) hours as needed for nausea or vomiting. 20 tablet 0   . oxyCODONE (OXY IR/ROXICODONE) 5 MG  immediate release tablet Take 1 tablet (5 mg total) by mouth every 6 (six) hours as needed for severe pain (pain score 7-10). 15 tablet 0     Review of Systems  Constitutional:  Negative for chills and fever.  HENT:  Negative for congestion and sore throat.   Eyes:  Negative for pain and visual disturbance.  Respiratory:  Negative for cough, chest tightness and shortness of breath.   Cardiovascular:  Negative for chest pain.  Gastrointestinal:  Positive for abdominal pain. Negative for diarrhea, nausea and vomiting.  Endocrine: Negative for cold intolerance and heat intolerance.  Genitourinary:  Negative for dysuria and flank pain.  Musculoskeletal:  Negative for back pain.  Skin:  Negative for rash.  Allergic/Immunologic: Negative for food allergies.  Neurological:  Negative for dizziness and light-headedness.  Psychiatric/Behavioral:  Negative for agitation.    Physical Exam   Blood pressure 120/78, pulse 85, temperature 98.1 F (36.7 C), resp. rate 18, height 5\' 4"  (1.626 m), weight 114.8 kg, last menstrual period 01/04/2024, SpO2 100%.  Physical Exam Vitals and nursing note reviewed.  Constitutional:      General: She is not in acute  distress.    Appearance: She is well-developed.  HENT:     Head: Normocephalic and atraumatic.  Eyes:     General: No scleral icterus.    Conjunctiva/sclera: Conjunctivae normal.  Cardiovascular:     Rate and Rhythm: Normal rate.  Pulmonary:     Effort: Pulmonary effort is normal.  Chest:     Chest wall: No tenderness.  Abdominal:     Palpations: Abdomen is soft.     Tenderness: There is abdominal tenderness. There is guarding. There is no rebound.  Genitourinary:    Vagina: Normal.  Musculoskeletal:        General: Normal range of motion.     Cervical back: Normal range of motion and neck supple.  Skin:    General: Skin is warm and dry.     Findings: No rash.  Neurological:     Mental Status: She is alert and oriented to person,  place, and time.    MAU Course  Procedures  MDM - concern for residual choledocolithiasis but most likely GERD given her sx. Discussed trial of medications for GERD and then reassessment. Would pursue imaging is negative. Less likely cardiac and will get BNP to r/o this as a cause. No pregnancy related sx and do not feel pregnancy assessment is warranted at this time.   Orders Placed This Encounter  Procedures  . Urinalysis, Routine w reflex microscopic -Urine, Clean Catch  . TSH  . Comprehensive metabolic panel  . Lipase, blood  . CBC  . hCG, quantitative, pregnancy  . Brain natriuretic peptide  . Pregnancy, urine POC  . ED EKG  . Discharge patient Discharge disposition: 01-Home or Self Care; Discharge patient date: 02/20/2024    Reviewed and interpreted labs and EKG personally which were all WNL BHCG is appropriately elevated at 25K Reassuring Lipase and CMP   Meds ordered this encounter  Medications  . DISCONTD: sucralfate (CARAFATE) 1 GM/10ML suspension 1 g  . famotidine (PEPCID) tablet 20 mg  . sucralfate (CARAFATE) 1 GM/10ML suspension    Sig: Take 10 mLs (1 g total) by mouth 4 (four) times daily -  with meals and at bedtime.    Dispense:  420 mL    Refill:  0  . ondansetron (ZOFRAN) 4 MG tablet    Sig: Take 1 tablet (4 mg total) by mouth every 8 (eight) hours as needed for nausea or vomiting.    Dispense:  20 tablet    Refill:  0  . famotidine (PEPCID) 20 MG tablet    Sig: Take 1 tablet (20 mg total) by mouth 2 (two) times daily.    Dispense:  60 tablet    Refill:  3   Reassessment, patient was much improved  Assessment and Plan   1. Gastroesophageal reflux disease with esophagitis without hemorrhage   2. Epigastric pain   3. S/P cholecystectomy   4. Early stage of pregnancy   5. [redacted] weeks gestation of pregnancy     Reassurance provided Discussed treatment for GERD Will establish prenatal care  Reviewed when to return to MAU  Federico Flake 02/20/2024, 3:24 PM

## 2024-02-20 NOTE — Discharge Instructions (Signed)
 You were seen in the maternity assessment unit for pain in the right side of your body under your right breast.  We think this was likely related to acid reflux in the setting of not having your gallbladder.  We sent you home on a medication called Pepcid which will help reduce the acid in your stomach.  We have also sent in a prescription for the liquid medicine you received here called sucralfate.  You can take this medication as needed for any rising or searing chest pain like you experienced before you came into the MAU today.

## 2024-02-20 NOTE — MAU Note (Signed)
.  Leslie Henderson is a 28 y.o. at Unknown here in MAU reporting: been having hot flashes for a few weeks. Started having some sharp pain in her chest and burning pain in her epigastric area that started today. Pt stated she had her gallbladder removed in December and some of these symptoms the same she was having before she had the surgery.   LMP: 01/07/24 Onset of complaint: today Pain score: 7 Vitals:   02/20/24 1343  BP: 120/78  Pulse: 85  Resp: 18  Temp: 98.1 F (36.7 C)  SpO2: 100%     FHT:   Lab orders placed from triage: UPT

## 2024-02-28 ENCOUNTER — Other Ambulatory Visit: Payer: Self-pay | Admitting: *Deleted

## 2024-02-28 DIAGNOSIS — O3680X0 Pregnancy with inconclusive fetal viability, not applicable or unspecified: Secondary | ICD-10-CM

## 2024-02-29 ENCOUNTER — Ambulatory Visit

## 2024-02-29 ENCOUNTER — Other Ambulatory Visit: Payer: Self-pay

## 2024-02-29 DIAGNOSIS — Z3A01 Less than 8 weeks gestation of pregnancy: Secondary | ICD-10-CM

## 2024-02-29 DIAGNOSIS — O3680X0 Pregnancy with inconclusive fetal viability, not applicable or unspecified: Secondary | ICD-10-CM

## 2024-02-29 DIAGNOSIS — Z3491 Encounter for supervision of normal pregnancy, unspecified, first trimester: Secondary | ICD-10-CM | POA: Diagnosis not present

## 2024-03-01 ENCOUNTER — Encounter: Payer: Self-pay | Admitting: Family Medicine

## 2024-03-05 ENCOUNTER — Inpatient Hospital Stay (HOSPITAL_COMMUNITY)
Admission: AD | Admit: 2024-03-05 | Discharge: 2024-03-05 | Disposition: A | Discharge status: Home / Self Care | Payer: Self-pay | Attending: Obstetrics and Gynecology | Admitting: Obstetrics and Gynecology

## 2024-03-05 DIAGNOSIS — Z3A08 8 weeks gestation of pregnancy: Secondary | ICD-10-CM | POA: Diagnosis not present

## 2024-03-05 DIAGNOSIS — Z79899 Other long term (current) drug therapy: Secondary | ICD-10-CM | POA: Insufficient documentation

## 2024-03-05 DIAGNOSIS — R112 Nausea with vomiting, unspecified: Secondary | ICD-10-CM | POA: Diagnosis present

## 2024-03-05 DIAGNOSIS — O219 Vomiting of pregnancy, unspecified: Secondary | ICD-10-CM | POA: Diagnosis not present

## 2024-03-05 LAB — URINALYSIS, ROUTINE W REFLEX MICROSCOPIC
Bilirubin Urine: NEGATIVE
Glucose, UA: NEGATIVE mg/dL
Hgb urine dipstick: NEGATIVE
Ketones, ur: 5 mg/dL — AB
Leukocytes,Ua: NEGATIVE
Nitrite: NEGATIVE
Protein, ur: NEGATIVE mg/dL
Specific Gravity, Urine: 1.023 (ref 1.005–1.030)
pH: 5 (ref 5.0–8.0)

## 2024-03-05 MED ORDER — ONDANSETRON 4 MG PO TBDP
4.0000 mg | ORAL_TABLET | Freq: Once | ORAL | Status: AC
  Administered 2024-03-05: 4 mg via ORAL
  Filled 2024-03-05: qty 1

## 2024-03-05 MED ORDER — ONDANSETRON 4 MG PO TBDP: 4.0000 mg | ORAL_TABLET | Freq: Four times a day (QID) | ORAL | 5 refills | Status: DC | PRN

## 2024-03-05 MED ORDER — SCOPOLAMINE 1 MG/3DAYS TD PT72: 1.0000 | MEDICATED_PATCH | TRANSDERMAL | 9 refills | Status: DC | PRN

## 2024-03-05 MED ORDER — SCOPOLAMINE 1 MG/3DAYS TD PT72
1.0000 | MEDICATED_PATCH | Freq: Once | TRANSDERMAL | Status: DC
  Administered 2024-03-05: 1.5 mg via TRANSDERMAL
  Filled 2024-03-05: qty 1

## 2024-03-05 MED ORDER — ALUM & MAG HYDROXIDE-SIMETH 200-200-20 MG/5ML PO SUSP
30.0000 mL | Freq: Once | ORAL | Status: AC
  Administered 2024-03-05: 30 mL via ORAL
  Filled 2024-03-05: qty 30

## 2024-03-05 MED ORDER — FAMOTIDINE 20 MG PO TABS
40.0000 mg | ORAL_TABLET | Freq: Once | ORAL | Status: AC
  Administered 2024-03-05: 40 mg via ORAL
  Filled 2024-03-05: qty 2

## 2024-03-05 MED ORDER — PROMETHAZINE HCL 25 MG PO TABS: 25.0000 mg | ORAL_TABLET | Freq: Four times a day (QID) | ORAL | 5 refills | Status: DC | PRN

## 2024-03-05 MED ORDER — PANTOPRAZOLE SODIUM 40 MG PO TBEC: 40.0000 mg | DELAYED_RELEASE_TABLET | Freq: Every day | ORAL | 3 refills | Status: DC

## 2024-03-05 NOTE — MAU Note (Addendum)
.  Leslie Henderson is a 28 y.o. at [redacted]w[redacted]d here in MAU reporting having her R upper wisdom tooth removed today due to infection. She had her gall bladder removed Dec 2024 and did not go to f/u appt. She was here March 11th with chest heaviness and was given med for reflex. Tonight she is having the same chest heaviness and discomfort.  States feels like someone sitting on her chest. She last took med for reflux yesterday. Pain is intermittent. She has not had this since 3/11 until tonight.  Some nausea but no emesis. States has not had much to eat or drink today due to dental pain and subsequent tooth removal. Denies any pregnancy concerns.   LMP: n/a Onset of complaint: 1845 Pain score: 7 Vitals:   03/05/24 2007  Pulse: 86  Resp: 18  Temp: 97.9 F (36.6 C)  SpO2: 100%     FHT: n/a  Lab orders placed from triage: u/a

## 2024-03-05 NOTE — MAU Provider Note (Signed)
 History     [id number]  Arrival date and time: 03/05/24 1955    Chief Complaint  Patient presents with   Nausea   Chest Pain     HPI Catricia Henderson is a 28 y.o. at [redacted]w[redacted]d by 7wk [place] with PMHx notable for cholecystitis, who presents for nausea and vomiting.   Here two weeks prior for similar symptoms, given GI cocktail with improvement in symptoms  Today reports she's felt unwell for about the past two days Has had lots of nausea and vomiting over that time Today had a tooth extracted, was given tylenol 3, amoxicillin, and an oral rinse Also having chest pain that started earlier this evening but is no longer present Has been using zofran intermittently and helps a bit Had similar issues in prior pregnancy  No vaginal bleeding or leaking fluid  --/--/A POS Performed at [facility], [address], [place], [place] [address]  (12/15 2120)  OB History     Gravida  4   Para  2   Term  2   Preterm      AB  1   Living  2      SAB      IAB  1   Ectopic      Multiple      Live Births              Past Medical History:  Diagnosis Date   Asthma    Cholelithiasis without obstruction    Constipation    Environmental and seasonal allergies     Past Surgical History:  Procedure Laterality Date   CHOLECYSTECTOMY N/A 11/29/2023   Procedure: LAPAROSCOPIC CHOLECYSTECTOMY;  Surgeon: Kinsinger, De Blanch, MD;  Location: [facility] OR;  Service: General;  Laterality: N/A;    Family History  Problem Relation Age of Onset   Cholelithiasis Maternal Grandmother     Social History   Socioeconomic History   Marital status: Single    Spouse name: Not on file   Number of children: Not on file   Years of education: Not on file   Highest education level: Not on file  Occupational History   Not on file  Tobacco Use   Smoking status: Never   Smokeless tobacco: Never  Substance and Sexual Activity   Alcohol use: No   Drug use: No   Sexual activity: Not  on file  Other Topics Concern   Not on file  Social History Narrative   10th grade 2014-2015   Social Drivers of Health   Financial Resource Strain: Not on file  Food Insecurity: Not on file  Transportation Needs: Not on file  Physical Activity: Not on file  Stress: Not on file  Social Connections: Not on file  Intimate Partner Violence: Not on file    No Known Allergies  No current facility-administered medications on file prior to encounter.   Current Outpatient Medications on File Prior to Encounter  Medication Sig Dispense Refill   famotidine (PEPCID) 20 MG tablet Take 1 tablet (20 mg total) by mouth 2 (two) times daily. 60 tablet 3   ondansetron (ZOFRAN) 4 MG tablet Take 1 tablet (4 mg total) by mouth every 8 (eight) hours as needed for nausea or vomiting. 20 tablet 0   Prenatal Vit-Fe Fumarate-FA (MULTIVITAMIN-PRENATAL) 27-0.8 MG TABS tablet Take 1 tablet by mouth daily at 12 noon.     sucralfate (CARAFATE) 1 GM/10ML suspension Take 10 mLs (1 g total) by mouth 4 (four) times  daily -  with meals and at bedtime. 420 mL 0     ROS Pertinent positives and negative per HPI, all others reviewed and negative  Physical Exam   Pulse 86   Temp 97.9 F (36.6 C)   Resp 18   Ht 5\' 4"  (1.626 m)   Wt 112.9 kg   LMP 01/04/2024 (Approximate)   SpO2 100%   BMI 42.74 kg/m   Patient Vitals for the past 24 hrs:  Temp Pulse Resp SpO2 Height Weight  03/05/24 2007 97.9 F (36.6 C) 86 18 100 % 5\' 4"  (1.626 m) 112.9 kg    Physical Exam Vitals reviewed.  Constitutional:      General: She is not in acute distress.    Appearance: She is well-developed. She is not diaphoretic.  Eyes:     General: No scleral icterus. Pulmonary:     Effort: Pulmonary effort is normal. No respiratory distress.  Skin:    General: Skin is warm and dry.  Neurological:     Mental Status: She is alert.     Coordination: Coordination normal.      Cervical Exam    Bedside Ultrasound Not  performed.  My interpretation: n/a  FHT Not done  Labs Results for orders placed or performed during the hospital encounter of 03/05/24 (from the past 24 hours)  Urinalysis, Routine w reflex microscopic -Urine, Clean Catch     Status: Abnormal   Collection Time: 03/05/24  8:20 PM  Result Value Ref Range   Color, Urine YELLOW YELLOW   APPearance CLEAR CLEAR   Specific Gravity, Urine 1.023 1.005 - 1.030   pH 5.0 5.0 - 8.0   Glucose, UA NEGATIVE NEGATIVE mg/dL   Hgb urine dipstick NEGATIVE NEGATIVE   Bilirubin Urine NEGATIVE NEGATIVE   Ketones, ur 5 (A) NEGATIVE mg/dL   Protein, ur NEGATIVE NEGATIVE mg/dL   Nitrite NEGATIVE NEGATIVE   Leukocytes,Ua NEGATIVE NEGATIVE    Imaging No results found.  MAU Course  Procedures Lab Orders         Urinalysis, Routine w reflex microscopic -Urine, Clean Catch    Meds ordered this encounter  Medications   alum & mag hydroxide-simeth (MAALOX/MYLANTA) 200-200-20 MG/5ML suspension 30 mL   famotidine (PEPCID) tablet 40 mg   ondansetron (ZOFRAN-ODT) disintegrating tablet 4 mg   scopolamine (TRANSDERM-SCOP) 1 MG/3DAYS 1.5 mg   ondansetron (ZOFRAN-ODT) 4 MG disintegrating tablet    Sig: Take 1 tablet (4 mg total) by mouth every 6 (six) hours as needed for nausea.    Dispense:  20 tablet    Refill:  5   scopolamine (TRANSDERM-SCOP) 1 MG/3DAYS    Sig: Place 1 patch (1.5 mg total) onto the skin every three (3) days as needed (nausea and vomiting).    Dispense:  10 patch    Refill:  9   pantoprazole (PROTONIX) 40 MG tablet    Sig: Take 1 tablet (40 mg total) by mouth daily.    Dispense:  90 tablet    Refill:  3   promethazine (PHENERGAN) 25 MG tablet    Sig: Take 1 tablet (25 mg total) by mouth every 6 (six) hours as needed for nausea or vomiting.    Dispense:  30 tablet    Refill:  5   Imaging Orders  No imaging studies ordered today    MDM Moderate (Level 3-4)  Assessment and Plan  #Nausea and vomiting of pregnancy #[redacted] weeks  gestation of pregnancy Patient presenting with ongoing n/v, UA  shows no significant dehydration. Patient asked about IVF but discussed this was not indicated based on UA. Patient at present suffering mainly with reflux symptoms and nausea/vomiting, but is not on a good regimen. We discussed taking zofran regularly and adding in scopolamine and phenergan to see how she feels (we also briefly discussed zofran in first trimester and risks of craniofacial and cardiac defects but that absolute risk increase in these things is still extremely small). We also discussed taking protonix for persistent reflux. She reports she has a OB visit in a few days, discussed trialing this regimen and f/u with primary OB provider in a few days to reassess and see if any adjustments are needed.    Dispo: discharged to home in stable condition    Venora Maples, MD/MPH 03/05/24 10:08 PM  Allergies as of 03/05/2024   No Known Allergies      Medication List     STOP taking these medications    ondansetron 4 MG tablet Commonly known as: Zofran       TAKE these medications    famotidine 20 MG tablet Commonly known as: PEPCID Take 1 tablet (20 mg total) by mouth 2 (two) times daily.   multivitamin-prenatal 27-0.8 MG Tabs tablet Take 1 tablet by mouth daily at 12 noon.   ondansetron 4 MG disintegrating tablet Commonly known as: ZOFRAN-ODT Take 1 tablet (4 mg total) by mouth every 6 (six) hours as needed for nausea.   pantoprazole 40 MG tablet Commonly known as: Protonix Take 1 tablet (40 mg total) by mouth daily.   promethazine 25 MG tablet Commonly known as: PHENERGAN Take 1 tablet (25 mg total) by mouth every 6 (six) hours as needed for nausea or vomiting.   scopolamine 1 MG/3DAYS Commonly known as: TRANSDERM-SCOP Place 1 patch (1.5 mg total) onto the skin every three (3) days as needed (nausea and vomiting).   sucralfate 1 GM/10ML suspension Commonly known as: CARAFATE Take 10 mLs (1 g  total) by mouth 4 (four) times daily -  with meals and at bedtime.

## 2024-03-09 ENCOUNTER — Ambulatory Visit
Admission: EM | Admit: 2024-03-09 | Discharge: 2024-03-09 | Disposition: A | Discharge status: Home / Self Care | Attending: Family Medicine | Admitting: Family Medicine

## 2024-03-09 DIAGNOSIS — Z202 Contact with and (suspected) exposure to infections with a predominantly sexual mode of transmission: Secondary | ICD-10-CM | POA: Diagnosis present

## 2024-03-09 DIAGNOSIS — Z3A09 9 weeks gestation of pregnancy: Secondary | ICD-10-CM | POA: Diagnosis present

## 2024-03-09 DIAGNOSIS — N760 Acute vaginitis: Secondary | ICD-10-CM | POA: Diagnosis present

## 2024-03-09 MED ORDER — CLOTRIMAZOLE 1 % VA CREA: 1.0000 | TOPICAL_CREAM | Freq: Every day | VAGINAL | 0 refills | Status: DC

## 2024-03-09 MED ORDER — METRONIDAZOLE 500 MG PO TABS
500.0000 mg | ORAL_TABLET | Freq: Two times a day (BID) | ORAL | 0 refills | Status: DC
Start: 2024-03-09 — End: 2024-03-13

## 2024-03-09 NOTE — ED Triage Notes (Addendum)
 Pt states she was notified today of +STD-c/o vaginal itching x today-pt states she is preg-NAD-steady gait

## 2024-03-09 NOTE — Discharge Instructions (Signed)
 Avoid all forms of sexual intercourse (oral, vaginal, anal) for the next 7 days to avoid spreading/reinfecting or at least until we can see what kinds of infection results are positive.  Abstaining for 2 weeks would be better but at least 1 week is required.  We will let you know about your test results from the swab we did today and if you need any prescriptions for antibiotics or changes to your treatment from today.

## 2024-03-09 NOTE — ED Provider Notes (Signed)
 [facility]  Note:  This document was prepared using Conservation officer, historic buildings and may include unintentional dictation errors.  MRN: [id number] DOB: [birth year]  Subjective:   Leslie Henderson is a 28 y.o. female [redacted] weeks pregnant presenting for 1 day history of vaginal itching.  Patient had exposure to confirmed cases of trichomonas.  Denies fever, n/v, abdominal pain, pelvic pain, rashes, dysuria, urinary frequency, hematuria, vaginal discharge.  Has a history of BV and yeast infection.  Would like to be covered for yeast infection.  No current facility-administered medications for this encounter.  Current Outpatient Medications:    famotidine (PEPCID) 20 MG tablet, Take 1 tablet (20 mg total) by mouth 2 (two) times daily., Disp: 60 tablet, Rfl: 3   ondansetron (ZOFRAN-ODT) 4 MG disintegrating tablet, Take 1 tablet (4 mg total) by mouth every 6 (six) hours as needed for nausea., Disp: 20 tablet, Rfl: 5   pantoprazole (PROTONIX) 40 MG tablet, Take 1 tablet (40 mg total) by mouth daily., Disp: 90 tablet, Rfl: 3   Prenatal Vit-Fe Fumarate-FA (MULTIVITAMIN-PRENATAL) 27-0.8 MG TABS tablet, Take 1 tablet by mouth daily at 12 noon., Disp: , Rfl:    promethazine (PHENERGAN) 25 MG tablet, Take 1 tablet (25 mg total) by mouth every 6 (six) hours as needed for nausea or vomiting., Disp: 30 tablet, Rfl: 5   scopolamine (TRANSDERM-SCOP) 1 MG/3DAYS, Place 1 patch (1.5 mg total) onto the skin every three (3) days as needed (nausea and vomiting)., Disp: 10 patch, Rfl: 9   sucralfate (CARAFATE) 1 GM/10ML suspension, Take 10 mLs (1 g total) by mouth 4 (four) times daily -  with meals and at bedtime., Disp: 420 mL, Rfl: 0   No Known Allergies  Past Medical History:  Diagnosis Date   Asthma    Cholelithiasis without obstruction    Constipation    Environmental and seasonal allergies      Past Surgical History:  Procedure Laterality Date   CHOLECYSTECTOMY N/A 11/29/2023    Procedure: LAPAROSCOPIC CHOLECYSTECTOMY;  Surgeon: Kinsinger, De Blanch, MD;  Location: [facility] OR;  Service: General;  Laterality: N/A;    Family History  Problem Relation Age of Onset   Cholelithiasis Maternal Grandmother     Social History   Tobacco Use   Smoking status: Never   Smokeless tobacco: Never  Vaping Use   Vaping status: Never Used  Substance Use Topics   Alcohol use: Yes    Comment: occ   Drug use: Yes    Types: Marijuana    ROS   Objective:   Vitals: BP 110/74 (BP Location: Right Arm)   Pulse 86   Temp 99.2 F (37.3 C) (Oral)   Resp 16   LMP 01/04/2024 (Approximate)   SpO2 98%   Physical Exam Constitutional:      General: She is not in acute distress.    Appearance: Normal appearance. She is well-developed. She is not ill-appearing, toxic-appearing or diaphoretic.  HENT:     Head: Normocephalic and atraumatic.     Nose: Nose normal.     Mouth/Throat:     Mouth: Mucous membranes are moist.  Eyes:     General: No scleral icterus.       Right eye: No discharge.        Left eye: No discharge.     Extraocular Movements: Extraocular movements intact.     Conjunctiva/sclera: Conjunctivae normal.  Cardiovascular:     Rate and Rhythm: Normal rate.  Pulmonary:  Effort: Pulmonary effort is normal.  Abdominal:     General: Bowel sounds are normal. There is no distension.     Palpations: Abdomen is soft. There is no mass.     Tenderness: There is no abdominal tenderness. There is no right CVA tenderness, left CVA tenderness, guarding or rebound.  Skin:    General: Skin is warm and dry.  Neurological:     General: No focal deficit present.     Mental Status: She is alert and oriented to person, place, and time.  Psychiatric:        Mood and Affect: Mood normal.        Behavior: Behavior normal.        Thought Content: Thought content normal.        Judgment: Judgment normal.     Assessment and Plan :   PDMP not reviewed this  encounter.  1. Acute vaginitis   2. Trichomonas exposure   3. [redacted] weeks gestation of pregnancy    Will treat empirically given confirmed exposure to trichomonas.  Recommend metronidazole.  Will use clotrimazole topically for suspected yeast vaginitis.  Labs pending, will treat as appropriate otherwise.  Counseled patient on potential for adverse effects with medications prescribed/recommended today, ER and return-to-clinic precautions discussed, patient verbalized understanding.    Wallis Bamberg, [place] 03/09/24 1240

## 2024-03-11 LAB — CERVICOVAGINAL ANCILLARY ONLY
Bacterial Vaginitis (gardnerella): POSITIVE — AB
Candida Glabrata: NEGATIVE
Candida Vaginitis: POSITIVE — AB
Chlamydia: NEGATIVE
Comment: NEGATIVE
Comment: NEGATIVE
Comment: NEGATIVE
Comment: NEGATIVE
Comment: NEGATIVE
Comment: NORMAL
Neisseria Gonorrhea: NEGATIVE
Trichomonas: NEGATIVE

## 2024-03-13 ENCOUNTER — Inpatient Hospital Stay (HOSPITAL_COMMUNITY)

## 2024-03-13 ENCOUNTER — Other Ambulatory Visit: Payer: Self-pay

## 2024-03-13 ENCOUNTER — Encounter (HOSPITAL_COMMUNITY): Payer: Self-pay | Admitting: Obstetrics and Gynecology

## 2024-03-13 ENCOUNTER — Inpatient Hospital Stay (HOSPITAL_COMMUNITY)
Admission: AD | Admit: 2024-03-13 | Discharge: 2024-03-13 | Disposition: A | Discharge status: Home / Self Care | Payer: Self-pay | Attending: Obstetrics and Gynecology | Admitting: Obstetrics and Gynecology

## 2024-03-13 DIAGNOSIS — Z3A09 9 weeks gestation of pregnancy: Secondary | ICD-10-CM

## 2024-03-13 DIAGNOSIS — O208 Other hemorrhage in early pregnancy: Secondary | ICD-10-CM | POA: Diagnosis present

## 2024-03-13 DIAGNOSIS — O219 Vomiting of pregnancy, unspecified: Secondary | ICD-10-CM

## 2024-03-13 DIAGNOSIS — O074 Failed attempted termination of pregnancy without complication: Secondary | ICD-10-CM | POA: Diagnosis not present

## 2024-03-13 DIAGNOSIS — F439 Reaction to severe stress, unspecified: Secondary | ICD-10-CM

## 2024-03-13 LAB — COMPREHENSIVE METABOLIC PANEL WITH GFR
ALT: 26 U/L (ref 0–44)
AST: 20 U/L (ref 15–41)
Albumin: 3.4 g/dL — ABNORMAL LOW (ref 3.5–5.0)
Alkaline Phosphatase: 59 U/L (ref 38–126)
Anion gap: 11 (ref 5–15)
BUN: 10 mg/dL (ref 6–20)
CO2: 18 mmol/L — ABNORMAL LOW (ref 22–32)
Calcium: 9.4 mg/dL (ref 8.9–10.3)
Chloride: 105 mmol/L (ref 98–111)
Creatinine, Ser: 0.81 mg/dL (ref 0.44–1.00)
GFR, Estimated: >60 mL/min (ref >60)
Glucose, Bld: 85 mg/dL (ref 70–99)
Potassium: 3.6 mmol/L (ref 3.5–5.1)
Sodium: 134 mmol/L — ABNORMAL LOW (ref 135–145)
Total Bilirubin: 0.5 mg/dL (ref 0.0–1.2)
Total Protein: 7.7 g/dL (ref 6.5–8.1)

## 2024-03-13 LAB — CBC
HCT: 39.6 % (ref 36.0–46.0)
Hemoglobin: 13.4 g/dL (ref 12.0–15.0)
MCH: 27.2 pg (ref 26.0–34.0)
MCHC: 33.8 g/dL (ref 30.0–36.0)
MCV: 80.3 fL (ref 80.0–100.0)
Platelets: 308 10*3/uL (ref 150–400)
RBC: 4.93 MIL/uL (ref 3.87–5.11)
RDW: 13.2 % (ref 11.5–15.5)
WBC: 6.2 10*3/uL (ref 4.0–10.5)
nRBC: 0 % (ref 0.0–0.2)

## 2024-03-13 LAB — URINALYSIS, ROUTINE W REFLEX MICROSCOPIC
Bilirubin Urine: NEGATIVE
Glucose, UA: NEGATIVE mg/dL
Hgb urine dipstick: NEGATIVE
Ketones, ur: 20 mg/dL — AB
Nitrite: NEGATIVE
Protein, ur: NEGATIVE mg/dL
Specific Gravity, Urine: 1.03 (ref 1.005–1.030)
pH: 5 (ref 5.0–8.0)

## 2024-03-13 LAB — HCG, QUANTITATIVE, PREGNANCY: hCG, Beta Chain, Quant, S: 98856 m[IU]/mL — ABNORMAL HIGH (ref <5)

## 2024-03-13 MED ORDER — ONDANSETRON HCL 4 MG PO TABS
4.0000 mg | ORAL_TABLET | Freq: Once | ORAL | Status: AC
  Administered 2024-03-13: 4 mg via ORAL
  Filled 2024-03-13: qty 1

## 2024-03-13 NOTE — MAU Note (Signed)
 Leslie Henderson is a 28 y.o. at [redacted]w[redacted]d here in MAU reporting: she she took 4 pills between 5-6 pm last night to initiate an abortion but hasn't had any VB and had some cramping that has stopped.  States she took meds that were left over from a previous abortion.  States she's concerned and wants to know if there is still a heartbeat because if so she will continue pregnancy. Reports she has some nausea and light headedness currently.  LMP: 01/04/2024 Onset of complaint: yesterday Pain score: 0 There were no vitals filed for this visit.   FHT: NA  Lab orders placed from triage: None

## 2024-03-13 NOTE — Discharge Instructions (Signed)
 You will receive a call to schedule an appointment with our group at [facility]. Please keep your appointment with your current OB on 03/15/2024 until you do have another appointment scheduled with [place].  Let your OB know that you took 4 pills of misoprostol. It will be important to monitor your pregnancy closely.  Take your Zofran when you need to for nausea. I also recommend starting a vitamin B6 supplement, and having small high-protein meals or snacks with water every 2-3 hours.   Have your OB fill out FMLA or short term disability paperwork for you!

## 2024-03-13 NOTE — MAU Provider Note (Signed)
 Chief Complaint:  Nausea   HPI   None     Leslie Henderson is a 28 y.o. [id number] at [redacted]w[redacted]d who presents to maternity admissions reporting nausea and lightheadedness. She previously ordered a "5 pill abortion regimen", took the first dose likely mifepristone on 11/25/2024. Presented to MAU with complaints of RUQ pain, nausea, vomiting, diarrhea. Last night she took 4 pills left from the previous abortion regimen around 5-6pm to induce abortion. Patient states that last night's medication was misoprostol. Denies vaginal bleeding, had some abdominal cramping that has since stopped. She has been under a lot of stress, she has been sick so much during this pregnancy that she is at risk of losing her job.  Pregnancy Course: Receives care at [facility]. Prenatal records reviewed. New OB appt on 03/08/2024. At that time, discussed prenatal labs, viability [place], anatomy ultrasound, genetic and carrier testing. Next appointment on 03/15/2024.  Past Medical History:  Diagnosis Date   Asthma    Cholelithiasis without obstruction    Constipation    Environmental and seasonal allergies    OB History  Gravida Para Term Preterm AB Living  4 2 2  1 2   SAB IAB Ectopic Multiple Live Births   1       # Outcome Date GA Lbr Len/2nd Weight Sex Type Anes PTL Lv  4 Current           3 IAB           2 Term           1 Term            Past Surgical History:  Procedure Laterality Date   CHOLECYSTECTOMY N/A 11/29/2023   Procedure: LAPAROSCOPIC CHOLECYSTECTOMY;  Surgeon: Kinsinger, De Blanch, MD;  Location: [facility] OR;  Service: General;  Laterality: N/A;   Family History  Problem Relation Age of Onset   Cholelithiasis Maternal Grandmother    Social History   Tobacco Use   Smoking status: Never   Smokeless tobacco: Never  Vaping Use   Vaping status: Never Used  Substance Use Topics   Alcohol use: Yes    Comment: occ   Drug use: Yes    Types: Marijuana   No Known  Allergies Medications Prior to Admission  Medication Sig Dispense Refill Last Dose/Taking   clotrimazole (GYNE-LOTRIMIN) 1 % vaginal cream Place 1 Applicatorful vaginally at bedtime. 45 g 0    famotidine (PEPCID) 20 MG tablet Take 1 tablet (20 mg total) by mouth 2 (two) times daily. 60 tablet 3    metroNIDAZOLE (FLAGYL) 500 MG tablet Take 1 tablet (500 mg total) by mouth 2 (two) times daily with a meal. DO NOT CONSUME ALCOHOL WHILE TAKING THIS MEDICATION. 14 tablet 0    ondansetron (ZOFRAN-ODT) 4 MG disintegrating tablet Take 1 tablet (4 mg total) by mouth every 6 (six) hours as needed for nausea. 20 tablet 5    pantoprazole (PROTONIX) 40 MG tablet Take 1 tablet (40 mg total) by mouth daily. 90 tablet 3    Prenatal Vit-Fe Fumarate-FA (MULTIVITAMIN-PRENATAL) 27-0.8 MG TABS tablet Take 1 tablet by mouth daily at 12 noon.      promethazine (PHENERGAN) 25 MG tablet Take 1 tablet (25 mg total) by mouth every 6 (six) hours as needed for nausea or vomiting. 30 tablet 5    scopolamine (TRANSDERM-SCOP) 1 MG/3DAYS Place 1 patch (1.5 mg total) onto the skin every three (3) days as needed (nausea and  vomiting). 10 patch 9     I have reviewed patient's Past Medical Hx, Surgical Hx, Family Hx, Social Hx, medications and allergies.   ROS  Pertinent items noted in HPI and remainder of comprehensive ROS otherwise negative.   PHYSICAL EXAM  Patient Vitals for the past 24 hrs:  BP Temp Temp src Pulse Resp SpO2 Height Weight  03/13/24 1305 126/81 98.4 F (36.9 C) Oral 76 18 98 % -- --  03/13/24 1257 -- -- -- -- -- -- 5\' 4"  (1.626 m) 110.1 kg    Constitutional: Well-developed, well-nourished female in no acute distress.  Cardiovascular: normal rate & rhythm, warm and well-perfused Respiratory: normal effort, no problems with respiration noted GI: Abd soft, non-tender, non-distended MSK: Extremities nontender, no edema, normal ROM Skin: warm and dry. Acyanotic, no jaundice or pallor. Neurologic: Alert and  oriented x 4. No abnormal coordination. Psychiatric: Normal mood, tearful affect. Speech not slurred, not rapid/pressured. Patient is cooperative. GU: no CVA tenderness    EKG: EKG: normal EKG, normal sinus rhythm, no PVCs as seen on previous EKGs.  Labs: Results for orders placed or performed during the hospital encounter of 03/13/24 (from the past 24 hours)  Urinalysis, Routine w reflex microscopic -Urine, Clean Catch     Status: Abnormal   Collection Time: 03/13/24  1:34 PM  Result Value Ref Range   Color, Urine YELLOW YELLOW   APPearance HAZY (A) CLEAR   Specific Gravity, Urine 1.030 1.005 - 1.030   pH 5.0 5.0 - 8.0   Glucose, UA NEGATIVE NEGATIVE mg/dL   Hgb urine dipstick NEGATIVE NEGATIVE   Bilirubin Urine NEGATIVE NEGATIVE   Ketones, ur 20 (A) NEGATIVE mg/dL   Protein, ur NEGATIVE NEGATIVE mg/dL   Nitrite NEGATIVE NEGATIVE   Leukocytes,Ua TRACE (A) NEGATIVE   RBC / HPF 6-10 0 - 5 RBC/hpf   WBC, UA 0-5 0 - 5 WBC/hpf   Bacteria, UA RARE (A) NONE SEEN   Squamous Epithelial / HPF 6-10 0 - 5 /HPF   Mucus PRESENT   hCG, quantitative, pregnancy     Status: Abnormal   Collection Time: 03/13/24  1:56 PM  Result Value Ref Range   hCG, Beta Chain, Quant, S 98,856 (H) <5 mIU/mL  CBC     Status: None   Collection Time: 03/13/24  1:57 PM  Result Value Ref Range   WBC 6.2 4.0 - 10.5 K/uL   RBC 4.93 3.87 - 5.11 MIL/uL   Hemoglobin 13.4 12.0 - 15.0 g/dL   HCT 04.5 40.9 - 81.1 %   MCV 80.3 80.0 - 100.0 fL   MCH 27.2 26.0 - 34.0 pg   MCHC 33.8 30.0 - 36.0 g/dL   RDW 91.4 78.2 - 95.6 %   Platelets 308 150 - 400 K/uL   nRBC 0.0 0.0 - 0.2 %  Comprehensive metabolic panel     Status: Abnormal   Collection Time: 03/13/24  1:57 PM  Result Value Ref Range   Sodium 134 (L) 135 - 145 mmol/L   Potassium 3.6 3.5 - 5.1 mmol/L   Chloride 105 98 - 111 mmol/L   CO2 18 (L) 22 - 32 mmol/L   Glucose, Bld 85 70 - 99 mg/dL   BUN 10 6 - 20 mg/dL   Creatinine, Ser 2.13 0.44 - 1.00 mg/dL    Calcium 9.4 8.9 - 08.6 mg/dL   Total Protein 7.7 6.5 - 8.1 g/dL   Albumin 3.4 (L) 3.5 - 5.0 g/dL   AST 20 15 -  41 U/L   ALT 26 0 - 44 U/L   Alkaline Phosphatase 59 38 - 126 U/L   Total Bilirubin 0.5 0.0 - 1.2 mg/dL   GFR, Estimated >98 >11 mL/min   Anion gap 11 5 - 15    Imaging:  US OB Comp Less 14 Wks Result Date: 03/13/2024 CLINICAL DATA:  [id number] Abdominal cramping complicating pregnancy [id number] Induced abortion [id number] EXAM: OBSTETRIC <14 WK ULTRASOUND TECHNIQUE: Transabdominal ultrasound was performed for evaluation of the gestation as well as the maternal uterus and adnexal regions. COMPARISON:  None Available. FINDINGS: Intrauterine gestational sac: Single Yolk sac:  Visualized. Embryo:  Visualized. Cardiac Activity: Visualized. Heart Rate: 162 bpm CRL:   20.8 mm   8 w 5 d                  [place] EDC: 10/18/2024. Subchorionic hemorrhage: A small anechoic subchorionic hemorrhage seen measured 7 x 7 mm, occupying less than 25% of the circumference of the gestational sac. Considering the anechoic echogenicity, this is favored chronic in age. Maternal uterus/adnexae: Uterus appears within normal limits. Bilateral ovaries are not distinctly visualized. IMPRESSION: *Single live intrauterine pregnancy with crown-rump length corresponding to 8 weeks 5 days. Electronically Signed   By: Jules Schick M.D.   On: 03/13/2024 16:15    MDM & MAU COURSE  MDM: High  MAU Course: -EKG for lightheadedness. NSR. -CBC, CMP, beta-hCG given symptoms and recent dose of abortion pills. UA to rule out other potential causes of abdominal cramping and nausea. -Abortion pills likely mifepristone first dose (December dose) and all 4 misoprostol pills last night. TVUS to evaluate for IUP. -Signs of dehydration on UA. Trace leukocytes can be normal in pregnancy, sending culture. -[place] confirms IUP, CRL corresponding to [redacted]w[redacted]d, with fetal cardiac activity.   Orders Placed This Encounter  Procedures   US OB Comp Less  14 Wks   Urinalysis, Routine w reflex microscopic -Urine, Clean Catch   hCG, quantitative, pregnancy   CBC   Comprehensive metabolic panel   AMB Referral [facility] Care Management   Amb ref to Integrated Behavioral Health   EKG 12-Lead   Discharge patient   Meds ordered this encounter  Medications   ondansetron (ZOFRAN) tablet 4 mg    ASSESSMENT   1. Failed attempted medical abortion   2. Nausea/vomiting in pregnancy   3. [redacted] weeks gestation of pregnancy   4. Stress     PLAN  Discharge home in stable condition with return precautions.  -Discussed ultrasound results with patient confirming IUP with fetal cardiac activity. Patient desires to continue pregnancy at this time. -After taking misoprostol, there is risk for birth defects, abortion, or premature birth. Congenital anomalies following first trimester exposure include skull defects, cranial nerve palsies, facial malformations, and limb defects. Misoprostol may produce uterine contractions leading to fetal death, uterine perforation, or abortion.  Patient aware that there are risks with continuing this pregnancy and the importance of close follow up with OB. -She would like to transfer care to [facility]. Sent message to admin to schedule, encouraged her to keep upcoming appointment with current OB to ensure close follow up in case [facility] does not have immediate availability. -Referrals placed to social work and integrated behavioral health for additional support. Patient will have FMLA and/or short term disability filled out to alleviate some of the stress associated with her job as well. -Use Zofran as needed for nausea. Encouraged to start vitamin B6 supplement and eat small high-protein  meals throughout the day. -May consider bile acid sequestrant for diarrhea after cholecystectomy.   Follow-up Information     [facility]. Go to.   Specialty: Obstetrics and Gynecology Why: to  establish prenatal care Contact information: [address] [place] [address] [phone]                Allergies as of 03/13/2024   No Known Allergies      Medication List     STOP taking these medications    metroNIDAZOLE 500 MG tablet Commonly known as: Flagyl   promethazine 25 MG tablet Commonly known as: PHENERGAN   scopolamine 1 MG/3DAYS Commonly known as: TRANSDERM-SCOP       TAKE these medications    clotrimazole 1 % vaginal cream Commonly known as: GYNE-LOTRIMIN Place 1 Applicatorful vaginally at bedtime.   famotidine 20 MG tablet Commonly known as: PEPCID Take 1 tablet (20 mg total) by mouth 2 (two) times daily.   multivitamin-prenatal 27-0.8 MG Tabs tablet Take 1 tablet by mouth daily at 12 noon.   ondansetron 4 MG disintegrating tablet Commonly known as: ZOFRAN-ODT Take 1 tablet (4 mg total) by mouth every 6 (six) hours as needed for nausea.   pantoprazole 40 MG tablet Commonly known as: Protonix Take 1 tablet (40 mg total) by mouth daily.        Melida Quitter, PA

## 2024-03-14 ENCOUNTER — Telehealth: Payer: Self-pay | Admitting: Family Medicine

## 2024-03-14 DIAGNOSIS — R7401 Elevation of levels of liver transaminase levels: Secondary | ICD-10-CM | POA: Insufficient documentation

## 2024-03-14 NOTE — Telephone Encounter (Signed)
 Called patient to schedule prenatal care and was sent to voicemail. Left detailed message for patient to call us back if she is still interested in transferring to [place].

## 2024-03-15 DIAGNOSIS — Z3A20 20 weeks gestation of pregnancy: Secondary | ICD-10-CM | POA: Insufficient documentation

## 2024-03-15 DIAGNOSIS — O210 Mild hyperemesis gravidarum: Secondary | ICD-10-CM | POA: Insufficient documentation

## 2024-03-15 DIAGNOSIS — O99211 Obesity complicating pregnancy, first trimester: Secondary | ICD-10-CM | POA: Insufficient documentation

## 2024-03-18 DIAGNOSIS — A749 Chlamydial infection, unspecified: Secondary | ICD-10-CM | POA: Insufficient documentation

## 2024-03-25 ENCOUNTER — Other Ambulatory Visit: Payer: Self-pay | Admitting: Family Medicine

## 2024-03-28 ENCOUNTER — Telehealth: Payer: Self-pay | Admitting: Clinical

## 2024-03-28 NOTE — Telephone Encounter (Signed)
Attempt call regarding referral; Left HIPPA-compliant message to call back Mikle Sternberg from [facility] at  [phone] (Ashelyn Mccravy's office).    

## 2024-03-30 ENCOUNTER — Inpatient Hospital Stay (HOSPITAL_COMMUNITY)
Admission: AD | Admit: 2024-03-30 | Discharge: 2024-03-30 | Disposition: A | Discharge status: Home / Self Care | Attending: Obstetrics & Gynecology | Admitting: Obstetrics & Gynecology

## 2024-03-30 ENCOUNTER — Other Ambulatory Visit: Payer: Self-pay

## 2024-03-30 ENCOUNTER — Encounter (HOSPITAL_COMMUNITY): Payer: Self-pay | Admitting: Obstetrics & Gynecology

## 2024-03-30 ENCOUNTER — Inpatient Hospital Stay (HOSPITAL_COMMUNITY)

## 2024-03-30 DIAGNOSIS — Z3A12 12 weeks gestation of pregnancy: Secondary | ICD-10-CM | POA: Diagnosis not present

## 2024-03-30 DIAGNOSIS — O039 Complete or unspecified spontaneous abortion without complication: Secondary | ICD-10-CM | POA: Diagnosis not present

## 2024-03-30 DIAGNOSIS — O209 Hemorrhage in early pregnancy, unspecified: Secondary | ICD-10-CM | POA: Diagnosis present

## 2024-03-30 LAB — CBC
HCT: 35.3 % — ABNORMAL LOW (ref 36.0–46.0)
Hemoglobin: 11.6 g/dL — ABNORMAL LOW (ref 12.0–15.0)
MCH: 26.4 pg (ref 26.0–34.0)
MCHC: 32.9 g/dL (ref 30.0–36.0)
MCV: 80.2 fL (ref 80.0–100.0)
Platelets: 322 10*3/uL (ref 150–400)
RBC: 4.4 MIL/uL (ref 3.87–5.11)
RDW: 13.6 % (ref 11.5–15.5)
WBC: 7.5 10*3/uL (ref 4.0–10.5)
nRBC: 0 % (ref 0.0–0.2)

## 2024-03-30 LAB — COMPREHENSIVE METABOLIC PANEL WITH GFR
ALT: 20 U/L (ref 0–44)
AST: 25 U/L (ref 15–41)
Albumin: 3 g/dL — ABNORMAL LOW (ref 3.5–5.0)
Alkaline Phosphatase: 47 U/L (ref 38–126)
Anion gap: 12 (ref 5–15)
BUN: 9 mg/dL (ref 6–20)
CO2: 18 mmol/L — ABNORMAL LOW (ref 22–32)
Calcium: 9 mg/dL (ref 8.9–10.3)
Chloride: 107 mmol/L (ref 98–111)
Creatinine, Ser: 0.77 mg/dL (ref 0.44–1.00)
GFR, Estimated: >60 mL/min (ref >60)
Glucose, Bld: 121 mg/dL — ABNORMAL HIGH (ref 70–99)
Potassium: 3.7 mmol/L (ref 3.5–5.1)
Sodium: 137 mmol/L (ref 135–145)
Total Bilirubin: 0.3 mg/dL (ref 0.0–1.2)
Total Protein: 7.2 g/dL (ref 6.5–8.1)

## 2024-03-30 LAB — HCG, QUANTITATIVE, PREGNANCY: hCG, Beta Chain, Quant, S: 43893 m[IU]/mL — ABNORMAL HIGH (ref <5)

## 2024-03-30 MED ORDER — IBUPROFEN 600 MG PO TABS
600.0000 mg | ORAL_TABLET | Freq: Once | ORAL | Status: AC
  Administered 2024-03-30: 600 mg via ORAL
  Filled 2024-03-30: qty 1

## 2024-03-30 MED ORDER — LACTATED RINGERS IV BOLUS
1000.0000 mL | Freq: Once | INTRAVENOUS | Status: AC
Start: 2024-03-30 — End: 2024-03-30
  Administered 2024-03-30: 1000 mL via INTRAVENOUS

## 2024-03-30 NOTE — Discharge Instructions (Signed)
 Return to MAU: If you have heavier bleeding that soaks through more that 2 pads per hour for an hour or more If you bleed so much that you feel like you might pass out or you do pass out If you have significant abdominal pain that is not improved with Tylenol 1000 mg every 8 hours as needed for pain If you develop a fever > 100.5

## 2024-03-30 NOTE — MAU Note (Addendum)
 Leslie Henderson is a 28 y.o. at [redacted]w[redacted]d here via EMS in MAU reporting: she's been cramping all morning than @ 1017 she had a gush of blood.  Reports large amount of blood with clots noted.  Pt reports she only saw blood and clots didn't notice a fetus. States she is currently cramping and has a HA.   Started [facility] in [place] within the past week, but plans to switch to [facility]  LMP: 01/04/2024 Onset of complaint: today Pain score: 10 Vitals:   03/30/24 1159  BP: 132/81  Pulse: (!) 122  Resp: 18  Temp: 97.7 F (36.5 C)  SpO2: 97%     FHT: deferred  Lab orders placed from triage: None

## 2024-03-30 NOTE — MAU Provider Note (Signed)
 History     CSN: [id number]  Arrival date and time: 03/30/24 1143   Event Date/Time   First Provider Initiated Contact with Patient 03/30/24 1211      Chief Complaint  Patient presents with   Vaginal Bleeding   Abdominal Pain   HPI Ms. Leslie Henderson is a 28 y.o. year old G20P2012 female at [redacted]w[redacted]d weeks gestation who presents to MAU via EMS reporting heavy vaginal bleeding that started at 1017 this morning. Per EMS, when they arrived at her home, there was about 500 mL of blood and clots in the bottom of her bath tub (there was NO water in the tub at the time). She receives [facility] with OB Provider in [place], [place]. She is transferring her prenatal care to [facility]; first appt there is 04/08/2024.   Review of medical records: She was seen in MAU on 03/13/2024 reporting that she took 4 pills to initiate an abortion. She reported the pills were leftover from a previous abortion. She stated that she would consider keeping the pregnancy, if there was still a heartbeat. On U/S, there was a viable 8 week 5 day IUP and a small anechoic subchorionic hemorrhage seen measured 7 x 7 mm, occupying less than 25% of the circumference of the gestational sac. Considering the anechoic echogenicity, this is favored chronic in age. She established [facility] with [facility] on 03/15/2024.  OB History     Gravida  4   Para  2   Term  2   Preterm      AB  1   Living  2      SAB      IAB  1   Ectopic      Multiple      Live Births              Past Medical History:  Diagnosis Date   Asthma    Cholelithiasis without obstruction    Constipation    Environmental and seasonal allergies     Past Surgical History:  Procedure Laterality Date   CHOLECYSTECTOMY N/A 11/29/2023   Procedure: LAPAROSCOPIC CHOLECYSTECTOMY;  Surgeon: Kinsinger, Alphonso Aschoff, MD;  Location: [facility] OR;  Service: General;  Laterality: N/A;    Family History  Problem Relation Age of Onset   Multiple sclerosis Mother     Cholelithiasis Maternal Grandmother     Social History   Tobacco Use   Smoking status: Never   Smokeless tobacco: Never  Vaping Use   Vaping status: Never Used  Substance Use Topics   Alcohol use: Yes    Comment: occ   Drug use: Yes    Types: Marijuana    Comment: February 22, 2024    Allergies: No Known Allergies  Medications Prior to Admission  Medication Sig Dispense Refill Last Dose/Taking   ondansetron  (ZOFRAN -ODT) 4 MG disintegrating tablet Take 1 tablet (4 mg total) by mouth every 6 (six) hours as needed for nausea. 20 tablet 5 03/29/2024   Prenatal Vit-Fe Fumarate-FA (MULTIVITAMIN-PRENATAL) 27-0.8 MG TABS tablet Take 1 tablet by mouth daily at 12 noon.   03/29/2024   clotrimazole  (GYNE-LOTRIMIN ) 1 % vaginal cream Place 1 Applicatorful vaginally at bedtime. 45 g 0    famotidine  (PEPCID ) 20 MG tablet Take 1 tablet (20 mg total) by mouth 2 (two) times daily. 60 tablet 3 03/28/2024   pantoprazole  (PROTONIX ) 40 MG tablet Take 1 tablet (40 mg total) by mouth daily. 90 tablet 3     Review of Systems  Constitutional:  Negative.   HENT: Negative.    Eyes: Negative.   Respiratory: Negative.    Cardiovascular: Negative.   Gastrointestinal: Negative.   Endocrine: Negative.   Genitourinary:  Positive for pelvic pain ("it hurts really bad") and vaginal bleeding.  Musculoskeletal: Negative.   Skin: Negative.   Allergic/Immunologic: Negative.   Neurological: Negative.   Hematological: Negative.   Psychiatric/Behavioral: Negative.     Physical Exam   Blood pressure 109/62, pulse (!) 112, temperature 97.7 F (36.5 C), temperature source Oral, resp. rate 16, last menstrual period 01/04/2024, SpO2 99%.  Physical Exam Constitutional:      Appearance: She is obese.  Cardiovascular:     Rate and Rhythm: Tachycardia present.  Pulmonary:     Effort: Pulmonary effort is normal.  Abdominal:     Palpations: Abdomen is soft.  Genitourinary:    General: Normal vulva.     Comments:  Pelvic exam: External genitalia normal, SE: vaginal walls pink and well rugated, cervix is smooth, pink, no lesions, large amt of BRB in vault, BBOW/sac visualized in cervical os. Bimanual exam deferred.  2nd Pelvic Exam @ 1645: Pelvic exam: External genitalia normal, SE: vaginal walls pink and well rugated, cervix is smooth, pink, no lesions, scant to small amt of BRB with some mucous coming out of cervical os - BBOW/sac previously seen NOT seen now. Musculoskeletal:        General: Normal range of motion.  Skin:    General: Skin is warm and dry.  Neurological:     Mental Status: She is oriented to person, place, and time.  Psychiatric:        Attention and Perception: Attention and perception normal.        Mood and Affect: Mood is anxious. Affect is tearful.        Speech: Speech normal.        Behavior: Behavior normal. Behavior is cooperative.        Thought Content: Thought content normal.        Cognition and Memory: Cognition and memory normal.        Judgment: Judgment normal.    FHTs not attempt in MAU  Reassessment @ 1645: 2nd speculum exam performed (see PE documentation). Patient's mother supportive at bedside.  MAU Course  Procedures  MDM CBC CMP OB <14 wks TVUS Triton: QBL = 550 mL  Results for orders placed or performed during the hospital encounter of 03/30/24 (from the past 24 hours)  CBC     Status: Abnormal   Collection Time: 03/30/24 12:22 PM  Result Value Ref Range   WBC 7.5 4.0 - 10.5 K/uL   RBC 4.40 3.87 - 5.11 MIL/uL   Hemoglobin 11.6 (L) 12.0 - 15.0 g/dL   HCT 78.4 (L) 69.6 - 29.5 %   MCV 80.2 80.0 - 100.0 fL   MCH 26.4 26.0 - 34.0 pg   MCHC 32.9 30.0 - 36.0 g/dL   RDW 28.4 13.2 - 44.0 %   Platelets 322 150 - 400 K/uL   nRBC 0.0 0.0 - 0.2 %  Comprehensive metabolic panel with GFR     Status: Abnormal   Collection Time: 03/30/24 12:22 PM  Result Value Ref Range   Sodium 137 135 - 145 mmol/L   Potassium 3.7 3.5 - 5.1 mmol/L   Chloride 107 98  - 111 mmol/L   CO2 18 (L) 22 - 32 mmol/L   Glucose, Bld 121 (H) 70 - 99 mg/dL   BUN 9 6 - 20  mg/dL   Creatinine, Ser 1.61 0.44 - 1.00 mg/dL   Calcium 9.0 8.9 - 09.6 mg/dL   Total Protein 7.2 6.5 - 8.1 g/dL   Albumin 3.0 (L) 3.5 - 5.0 g/dL   AST 25 15 - 41 U/L   ALT 20 0 - 44 U/L   Alkaline Phosphatase 47 38 - 126 U/L   Total Bilirubin 0.3 0.0 - 1.2 mg/dL   GFR, Estimated >04 >54 mL/min   Anion gap 12 5 - 15  hCG, quantitative, pregnancy     Status: Abnormal   Collection Time: 03/30/24  2:30 PM  Result Value Ref Range   hCG, Beta Chain, Quant, S 43,893 (H) <5 mIU/mL    US  OB Comp Less 14 Wks Result Date: 03/30/2024 CLINICAL DATA:  Vaginal bleeding EXAM: OBSTETRIC <14 WK ULTRASOUND TECHNIQUE: Transabdominal ultrasound was performed for evaluation of the gestation as well as the maternal uterus and adnexal regions. COMPARISON:  03/13/2024 FINDINGS: Intrauterine gestational sac: Not visualized Yolk sac:  Not visualized Embryo:  Not visualized Maternal uterus/adnexae: Heterogenous endometrial thickening measuring up to 20.2 mm, greatest in the lower uterine segment and cervix. Ovaries are within normal limits. Right ovary measures 2.5 x 1.5 x 2.8 cm. Left ovary measures 2.4 x 1.6 x 1.9 cm. No significant free fluid IMPRESSION: 1. The previously noted IUP is no longer visualized. Heterogenous endometrial thickening measuring up to 20 mm, greatest in the lower uterine segment and cervix, suspicious for blood product and miscarriage in progress. Electronically Signed   By: Esmeralda Hedge M.D.   On: 03/30/2024 16:11   Assessment and Plan  1. Complete miscarriage (Primary) - Advised that baby was not seen on ultrasound. Patient unsure if she passed the baby while having a BM x 2 episodes in the BR in MAU. - Comfort measures offered. - Supportive care given  2. Vaginal bleeding affecting early pregnancy - Return to MAU: If you have heavier bleeding that soaks through more that 2 pads per hour for  an hour or more If you bleed so much that you feel like you might pass out or you do pass out If you have significant abdominal pain that is not improved with Tylenol  1000 mg every 8 hours as needed for pain If you develop a fever > 100.5   3. [redacted] weeks gestation of pregnancy   - Discharge home - Will need to have HCG repeated on and see a provider as scheduled on 04/08/2024 - Message sent to [facility] to change appt type to f/u after SAB and have HCG drawn on that same day - Patient verbalized an understanding of the plan of care and agrees.   Almond Army, CNM 03/30/2024, 12:11 PM

## 2024-04-07 NOTE — Progress Notes (Deleted)
 GYNECOLOGY  VISIT   HPI: Leslie Henderson is a 28 y.o.   Single  {Race/ethnicity:17218}  female   [id number] here for SAB (03/30/24) follow-up.     Pain, bleeding, ROS   GYNECOLOGIC HISTORY:  Patient's last menstrual period was 01/04/2024 (approximate). Contraception: {contraception:315051}.   Menopausal hormone therapy:  *** Last mammogram:  *** Last pap smear: No results found for: "DIAGPAP"         OB History     Gravida  4   Para  2   Term  2   Preterm      AB  1   Living  2      SAB      IAB  1   Ectopic      Multiple      Live Births                 Patient Active Problem List   Diagnosis Date Noted   Cholecystitis 11/29/2023   Implanon removal 04/07/2022   Contraception management 04/07/2022   Constipation    Cholelithiasis without obstruction     Past Medical History:  Diagnosis Date   Asthma    Cholelithiasis without obstruction    Constipation    Environmental and seasonal allergies     Past Surgical History:  Procedure Laterality Date   CHOLECYSTECTOMY N/A 11/29/2023   Procedure: LAPAROSCOPIC CHOLECYSTECTOMY;  Surgeon: Derral Flick, MD;  Location: [facility] OR;  Service: General;  Laterality: N/A;    Current Outpatient Medications  Medication Sig Dispense Refill   famotidine  (PEPCID ) 20 MG tablet Take 1 tablet (20 mg total) by mouth 2 (two) times daily. 60 tablet 3   ondansetron  (ZOFRAN -ODT) 4 MG disintegrating tablet Take 1 tablet (4 mg total) by mouth every 6 (six) hours as needed for nausea. 20 tablet 5   pantoprazole  (PROTONIX ) 40 MG tablet Take 1 tablet (40 mg total) by mouth daily. 90 tablet 3   Prenatal Vit-Fe Fumarate-FA (MULTIVITAMIN-PRENATAL) 27-0.8 MG TABS tablet Take 1 tablet by mouth daily at 12 noon.     No current facility-administered medications for this visit.     ALLERGIES: Patient has no known allergies.  Family History  Problem Relation Age of Onset   Multiple sclerosis Mother    Cholelithiasis Maternal  Grandmother     Social History   Socioeconomic History   Marital status: Single    Spouse name: Not on file   Number of children: Not on file   Years of education: Not on file   Highest education level: Not on file  Occupational History   Not on file  Tobacco Use   Smoking status: Never   Smokeless tobacco: Never  Vaping Use   Vaping status: Never Used  Substance and Sexual Activity   Alcohol use: Yes    Comment: occ   Drug use: Yes    Types: Marijuana    Comment: February 22, 2024   Sexual activity: Yes    Birth control/protection: None  Other Topics Concern   Not on file  Social History Narrative   10th grade 2014-2015   Social Drivers of Health   Financial Resource Strain: Not on file  Food Insecurity: Low Risk  (03/18/2024)   Received from [facility]   Hunger Vital Sign    Worried About Running Out of Food in the Last Year: Never true    Ran Out of Food in the Last Year: Never true  Transportation Needs: No Transportation Needs (03/18/2024)  Received from [facility]    In the past 12 months, has lack of reliable transportation kept you from medical appointments, meetings, work or from getting things needed for daily living? : No  Physical Activity: Not on file  Stress: Not on file  Social Connections: Not on file  Intimate Partner Violence: Not on file    Review of Systems  PHYSICAL EXAMINATION:    LMP 01/04/2024 (Approximate)     General appearance: alert, cooperative and appears stated age Head: Normocephalic, without obvious abnormality, atraumatic Neck: no adenopathy, supple, symmetrical, trachea midline and thyroid  normal to inspection and palpation Lungs: clear to auscultation bilaterally Breasts: normal appearance, no masses or tenderness, No nipple retraction or dimpling, No nipple discharge or bleeding, No axillary or supraclavicular adenopathy Heart: regular rate and rhythm Abdomen: soft, non-tender, no masses,  no  organomegaly Extremities: extremities normal, atraumatic, no cyanosis or edema Skin: Skin color, texture, turgor normal. No rashes or lesions Lymph nodes: Cervical, supraclavicular, and axillary nodes normal. No abnormal inguinal nodes palpated Neurologic: Grossly normal  Pelvic: External genitalia:  no lesions              Urethra:  normal appearing urethra with no masses, tenderness or lesions              Bartholins and Skenes: normal                 Vagina: normal appearing vagina with normal color and discharge, no lesions              Cervix: no lesions                Bimanual Exam:  Uterus:  normal size, contour, position, consistency, mobility, non-tender              Adnexa: no mass, fullness, tenderness              Rectal exam: {yes no:314532}.  Confirms.              Anus:  normal sphincter tone, no lesions  Chaperone was present for exam  ASSESSMENT & PLAN  There are no diagnoses linked to this encounter.      An After Visit Summary was printed and given to the patient.   Hersey Maclellan E Clayson Riling, PA-C 4/27/20252:16 PM

## 2024-04-08 ENCOUNTER — Ambulatory Visit: Admitting: Physician Assistant

## 2024-04-08 ENCOUNTER — Other Ambulatory Visit

## 2024-05-18 ENCOUNTER — Other Ambulatory Visit: Payer: Self-pay | Admitting: Family Medicine

## 2024-08-02 ENCOUNTER — Inpatient Hospital Stay (HOSPITAL_COMMUNITY)
Admission: EM | Admit: 2024-08-02 | Discharge: 2024-08-02 | Disposition: A | Discharge status: Home / Self Care | Source: Intra-hospital | Attending: Obstetrics & Gynecology | Admitting: Obstetrics & Gynecology

## 2024-08-02 ENCOUNTER — Encounter (HOSPITAL_COMMUNITY): Payer: Self-pay | Admitting: Emergency Medicine

## 2024-08-02 ENCOUNTER — Other Ambulatory Visit: Payer: Self-pay

## 2024-08-02 DIAGNOSIS — R519 Headache, unspecified: Secondary | ICD-10-CM

## 2024-08-02 DIAGNOSIS — O26891 Other specified pregnancy related conditions, first trimester: Secondary | ICD-10-CM | POA: Insufficient documentation

## 2024-08-02 DIAGNOSIS — O219 Vomiting of pregnancy, unspecified: Secondary | ICD-10-CM

## 2024-08-02 DIAGNOSIS — Z3A08 8 weeks gestation of pregnancy: Secondary | ICD-10-CM | POA: Diagnosis not present

## 2024-08-02 LAB — URINALYSIS, ROUTINE W REFLEX MICROSCOPIC
Bilirubin Urine: NEGATIVE
Glucose, UA: NEGATIVE mg/dL
Hgb urine dipstick: NEGATIVE
Ketones, ur: 80 mg/dL — AB
Leukocytes,Ua: NEGATIVE
Nitrite: NEGATIVE
Protein, ur: 100 mg/dL — AB
Specific Gravity, Urine: 1.031 — ABNORMAL HIGH (ref 1.005–1.030)
pH: 5 (ref 5.0–8.0)

## 2024-08-02 LAB — COMPREHENSIVE METABOLIC PANEL WITH GFR
ALT: 20 U/L (ref 0–44)
AST: 22 U/L (ref 15–41)
Albumin: 3.4 g/dL — ABNORMAL LOW (ref 3.5–5.0)
Alkaline Phosphatase: 50 U/L (ref 38–126)
Anion gap: 11 (ref 5–15)
BUN: 8 mg/dL (ref 6–20)
CO2: 21 mmol/L — ABNORMAL LOW (ref 22–32)
Calcium: 9.4 mg/dL (ref 8.9–10.3)
Chloride: 106 mmol/L (ref 98–111)
Creatinine, Ser: 0.79 mg/dL (ref 0.44–1.00)
GFR, Estimated: >60 mL/min (ref >60)
Glucose, Bld: 87 mg/dL (ref 70–99)
Potassium: 3.5 mmol/L (ref 3.5–5.1)
Sodium: 138 mmol/L (ref 135–145)
Total Bilirubin: 0.5 mg/dL (ref 0.0–1.2)
Total Protein: 7.7 g/dL (ref 6.5–8.1)

## 2024-08-02 LAB — CBC
HCT: 37.6 % (ref 36.0–46.0)
Hemoglobin: 12.3 g/dL (ref 12.0–15.0)
MCH: 24.8 pg — ABNORMAL LOW (ref 26.0–34.0)
MCHC: 32.7 g/dL (ref 30.0–36.0)
MCV: 75.8 fL — ABNORMAL LOW (ref 80.0–100.0)
Platelets: 292 K/uL (ref 150–400)
RBC: 4.96 MIL/uL (ref 3.87–5.11)
RDW: 16.2 % — ABNORMAL HIGH (ref 11.5–15.5)
WBC: 6.5 K/uL (ref 4.0–10.5)
nRBC: 0 % (ref 0.0–0.2)

## 2024-08-02 LAB — POC URINE PREG, ED: Preg Test, Ur: POSITIVE — AB

## 2024-08-02 MED ORDER — ACETAMINOPHEN-CAFFEINE 500-65 MG PO TABS
2.0000 | ORAL_TABLET | Freq: Once | ORAL | Status: AC
  Administered 2024-08-02: 2 via ORAL
  Filled 2024-08-02: qty 2

## 2024-08-02 MED ORDER — SCOPOLAMINE 1 MG/3DAYS TD PT72: 1.0000 | MEDICATED_PATCH | TRANSDERMAL | 0 refills | Status: AC

## 2024-08-02 MED ORDER — ONDANSETRON HCL 4 MG PO TABS: 8.0000 mg | ORAL_TABLET | Freq: Two times a day (BID) | ORAL | 0 refills | Status: AC

## 2024-08-02 MED ORDER — LACTATED RINGERS IV BOLUS
1000.0000 mL | Freq: Once | INTRAVENOUS | Status: AC
  Administered 2024-08-02: 1000 mL via INTRAVENOUS

## 2024-08-02 MED ORDER — ONDANSETRON HCL 4 MG/2ML IJ SOLN
4.0000 mg | Freq: Once | INTRAMUSCULAR | Status: AC
  Administered 2024-08-02: 4 mg via INTRAVENOUS
  Filled 2024-08-02: qty 2

## 2024-08-02 MED ORDER — ACETAMINOPHEN-CAFFEINE 500-65 MG PO TABS
2.0000 | ORAL_TABLET | ORAL | 0 refills | Status: AC | PRN
Start: 2024-08-02 — End: ?

## 2024-08-02 MED ORDER — FAMOTIDINE IN NACL 20-0.9 MG/50ML-% IV SOLN
20.0000 mg | Freq: Once | INTRAVENOUS | Status: AC
  Administered 2024-08-02: 20 mg via INTRAVENOUS
  Filled 2024-08-02: qty 50

## 2024-08-02 NOTE — ED Triage Notes (Signed)
 Pt BIb PTAR from home for NV beginning WEd. Not much PO intake since then. She has a scrip for phenergan  that hasn't been helping.  Pt does not endorse any pain.  A&O, ambulatory.   Pt endorses being [redacted] weeks pregnant, confirmed by doctor.   HR 80 RR 15, 100%, 142/84 T:96.7

## 2024-08-02 NOTE — MAU Note (Signed)
 Leslie Henderson is a 28 y.o. at Unknown here in MAU reporting: seen in main er for N/V. Patient states hasn't eaten since Wednesday. Also c/o headache. Patient denies VB or cramping.     Pain score: 7/10 head Vitals:   08/02/24 0955  BP: 125/77  Pulse: 72  Resp: 16  Temp: 97.7 F (36.5 C)  SpO2: 100%      Lab orders placed from triage: none

## 2024-08-02 NOTE — MAU Provider Note (Signed)
 S Ms. Leslie Henderson is a 28 y.o. [phone] patient who presents to MAU as a transfer from the ED with complaints of nausea and vomiting at [redacted]w[redacted]d.  Patient reports she has not been able to keep solid food down since Wednesday and is also complaining of a headache  and has not taken any medication for resolve. Patient reports she has been taking Phenergan  at home with no relief.  She denies any obstetrical complaints no vaginal bleeding, no leaking of fluid , no abdominal cramping  and reports she had an IUP  documented by her Primary OB in [facility]  Prenatal care in [place]  O BP 125/77   Pulse 72   Temp 97.7 F (36.5 C) (Oral)   Resp 16   Ht 5' 4 (1.626 m)   Wt 112 kg   LMP  (LMP Unknown)   SpO2 100%   Breastfeeding Unknown   BMI 42.40 kg/m   Review of Systems  Gastrointestinal:  Positive for heartburn, nausea and vomiting.  Neurological:  Positive for headaches.  All other systems reviewed and are negative.   Physical Exam Vitals and nursing note reviewed.  Constitutional:      General: She is not in acute distress.    Appearance: Normal appearance. She is obese. She is not ill-appearing.  HENT:     Nose: Nose normal.     Mouth/Throat:     Mouth: Mucous membranes are moist.  Cardiovascular:     Rate and Rhythm: Normal rate.  Pulmonary:     Effort: Pulmonary effort is normal.  Abdominal:     General: There is no distension.     Palpations: Abdomen is soft.     Tenderness: There is no abdominal tenderness. There is no guarding.  Musculoskeletal:        General: Normal range of motion.     Cervical back: Normal range of motion.  Skin:    General: Skin is warm.     Coloration: Skin is not pale.  Neurological:     Mental Status: She is alert and oriented to person, place, and time.  Psychiatric:        Mood and Affect: Mood normal.        Behavior: Behavior normal.     MDM  HIGH  N/V In early pregnancy - CBC, CMP - UA - IVF Bolus - IV Pepcid   /Zofran  - Scopolamine   patch  HA in pregnancy -Excedrin ordered with relief noted   Will plan for discharge with RX's at discharge  Orders Placed This Encounter  Procedures   CBC    Standing Status:   Standing    Number of Occurrences:   1   Comprehensive metabolic panel    Standing Status:   Standing    Number of Occurrences:   1   Urinalysis, Routine w reflex microscopic -Urine, Random    Standing Status:   Standing    Number of Occurrences:   1    Specimen Source:   Urine, Random [244]   POC Urine Pregnancy, ED (not at [facility] or [facility])    Standing Status:   Standing    Number of Occurrences:   1    Meds ordered this encounter  Medications   lactated ringers  bolus 1,000 mL   famotidine  (PEPCID ) IVPB 20 mg premix   ondansetron  (ZOFRAN ) injection 4 mg   acetaminophen -caffeine  (EXCEDRIN TENSION HEADACHE) 500-65 MG per tablet 2 tablet   scopolamine  (TRANSDERM-SCOP) 1 MG/3DAYS    Sig:  Place 1 patch (1 mg total) onto the skin every 3 (three) days.    Dispense:  10 patch    Refill:  0    Supervising Provider:   PRATT, TANYA S [[id number]]   ondansetron  (ZOFRAN ) 4 MG tablet    Sig: Take 2 tablets (8 mg total) by mouth 2 (two) times daily.    Dispense:  20 tablet    Refill:  0    Supervising Provider:   PRATT, TANYA S [[id number]]   acetaminophen -caffeine  (EXCEDRIN TENSION HEADACHE) 500-65 MG TABS per tablet    Sig: Take 2 tablets by mouth as needed (For  headaches).    Dispense:  60 tablet    Refill:  0    Supervising Provider:   PRATT, TANYA S [[id number]]     Results for orders placed or performed during the hospital encounter of 08/02/24 (from the past 24 hours)  POC Urine Pregnancy, ED (not at [facility] or [facility])     Status: Abnormal   Collection Time: 08/02/24 10:21 AM  Result Value Ref Range   Preg Test, Ur POSITIVE (A) NEGATIVE     I have reviewed the patient chart and performed the physical exam . I have ordered & interpreted the lab results and reviewed them with the patient  Medications  ordered as stated above.  A/P as described below.  Counseling and education provided and patient agreeable  with plan as described below. Verbalized understanding.    ASSESSMENT Medical screening exam complete 1. Nausea and vomiting during pregnancy (Primary)  2. [redacted] weeks gestation of pregnancy  3. Nonintractable headache, unspecified chronicity pattern, unspecified headache type    PLAN  Allergies as of 08/02/2024   No Known Allergies      Medication List     TAKE these medications    acetaminophen -caffeine  500-65 MG Tabs per tablet Commonly known as: EXCEDRIN TENSION HEADACHE Take 2 tablets by mouth as needed (For  headaches).   famotidine  20 MG tablet Commonly known as: PEPCID  Take 1 tablet (20 mg total) by mouth 2 (two) times daily.   multivitamin-prenatal 27-0.8 MG Tabs tablet Take 1 tablet by mouth daily at 12 noon.   ondansetron  4 MG disintegrating tablet Commonly known as: ZOFRAN -ODT Take 1 tablet (4 mg total) by mouth every 6 (six) hours as needed for nausea.   ondansetron  4 MG tablet Commonly known as: Zofran  Take 2 tablets (8 mg total) by mouth 2 (two) times daily.   pantoprazole  40 MG tablet Commonly known as: Protonix  Take 1 tablet (40 mg total) by mouth daily.   promethazine  12.5 MG tablet Commonly known as: PHENERGAN  Take 12.5 mg by mouth every 6 (six) hours as needed for nausea or vomiting.   scopolamine  1 MG/3DAYS Commonly known as: TRANSDERM-SCOP Place 1 patch (1 mg total) onto the skin every 3 (three) days.         F/U with Primary OB Provider   Discharge from MAU in stable condition  See AVS for full description of educational information and instructions provided to the patient at time of discharge  Warning signs for worsening condition that would warrant emergency follow-up discussed Patient may return to MAU as needed   Littie Olam LABOR, NP 08/02/2024 11:25 AM

## 2024-08-02 NOTE — ED Provider Notes (Addendum)
 28 year old G4P2 roughly [redacted] weeks gestation who presents to the ED due to nausea and vomiting that started 2 days ago.  Unable to tolerate p.o.  Has been taking Phenergan  with no relief.  Follows OB in [place].  Has had a ultrasound to confirm IUP per patient.  No vaginal bleeding. Stable vitals. Pregnancy test positive here in the ED.  Discussed with Olam at [facility] who accepts patient for transfer.   Lorelle Aleck BROCKS, PA-C 08/02/24 1036    Emil Share, DO 08/02/24 1046

## 2024-08-12 ENCOUNTER — Inpatient Hospital Stay (HOSPITAL_COMMUNITY)

## 2024-08-12 ENCOUNTER — Encounter (HOSPITAL_COMMUNITY): Payer: Self-pay | Admitting: Obstetrics & Gynecology

## 2024-08-12 ENCOUNTER — Inpatient Hospital Stay (HOSPITAL_COMMUNITY)
Admission: AD | Admit: 2024-08-12 | Discharge: 2024-08-12 | Disposition: A | Discharge status: Home / Self Care | Payer: Self-pay | Attending: Obstetrics & Gynecology | Admitting: Obstetrics & Gynecology

## 2024-08-12 DIAGNOSIS — Z3A09 9 weeks gestation of pregnancy: Secondary | ICD-10-CM

## 2024-08-12 DIAGNOSIS — O219 Vomiting of pregnancy, unspecified: Secondary | ICD-10-CM | POA: Diagnosis not present

## 2024-08-12 DIAGNOSIS — O418X10 Other specified disorders of amniotic fluid and membranes, first trimester, not applicable or unspecified: Secondary | ICD-10-CM

## 2024-08-12 DIAGNOSIS — O468X1 Other antepartum hemorrhage, first trimester: Secondary | ICD-10-CM

## 2024-08-12 DIAGNOSIS — R519 Headache, unspecified: Secondary | ICD-10-CM | POA: Diagnosis not present

## 2024-08-12 DIAGNOSIS — O26851 Spotting complicating pregnancy, first trimester: Secondary | ICD-10-CM | POA: Diagnosis present

## 2024-08-12 DIAGNOSIS — R109 Unspecified abdominal pain: Secondary | ICD-10-CM | POA: Insufficient documentation

## 2024-08-12 LAB — CBC
HCT: 36 % (ref 36.0–46.0)
Hemoglobin: 11.7 g/dL — ABNORMAL LOW (ref 12.0–15.0)
MCH: 25.2 pg — ABNORMAL LOW (ref 26.0–34.0)
MCHC: 32.5 g/dL (ref 30.0–36.0)
MCV: 77.6 fL — ABNORMAL LOW (ref 80.0–100.0)
Platelets: 259 K/uL (ref 150–400)
RBC: 4.64 MIL/uL (ref 3.87–5.11)
RDW: 16.2 % — ABNORMAL HIGH (ref 11.5–15.5)
WBC: 5.9 K/uL (ref 4.0–10.5)
nRBC: 0 % (ref 0.0–0.2)

## 2024-08-12 LAB — ABO/RH: ABO/RH(D): A POS

## 2024-08-12 LAB — HCG, QUANTITATIVE, PREGNANCY: hCG, Beta Chain, Quant, S: 91742 m[IU]/mL — ABNORMAL HIGH (ref <5)

## 2024-08-12 MED ORDER — ONDANSETRON 4 MG PO TBDP
8.0000 mg | ORAL_TABLET | Freq: Once | ORAL | Status: AC
  Administered 2024-08-12: 8 mg via ORAL
  Filled 2024-08-12: qty 2

## 2024-08-12 MED ORDER — ACETAMINOPHEN-CAFFEINE 500-65 MG PO TABS
2.0000 | ORAL_TABLET | Freq: Once | ORAL | Status: AC
  Administered 2024-08-12: 2 via ORAL
  Filled 2024-08-12: qty 2

## 2024-08-12 NOTE — MAU Note (Signed)
 Pt back from U/S and vomited . Stated she though it was because she was laying flt during the procedure. Notified provider zofran  ordered

## 2024-08-12 NOTE — MAU Note (Signed)
 Leslie Henderson is a 28 y.o. at [redacted]w[redacted]d here in MAU reporting: EMS Arrival for spotting that started yesterday. Passed a few more clots this morning and some more cramping. Had pt sign in at registration. She went to the BR and she thought she passed a large clot in toilet with a lot of dark red blood. Examined contents and nothing solid was in toilet.   LMP:  Onset of complaint: yesterday Pain score: 5 Vitals:   08/12/24 1012  BP: (!) 148/88  Pulse: 81  Resp: 18  Temp: 98.1 F (36.7 C)     FHT: n/a  Lab orders placed from triage:

## 2024-08-12 NOTE — MAU Provider Note (Addendum)
 Chief Complaint:  Vaginal Bleeding and Abdominal Pain  HPI   Event Date/Time   First Provider Initiated Contact with Patient 08/12/24 1056    Leslie Henderson is a 28 y.o. [id number] at [redacted]w[redacted]d who presents to maternity admissions reporting vaginal bleeding, abdominal cramping, and headache. Noticed vaginal spotting yesterday evening that progressed to heavy bleeding with clots this morning. Abdominal cramping began this morning. Also reporting headache.  Pregnancy Course: IUP confirmed at [facility] in [place], [place], seen at [facility] 08/02/24 for N/V  Past Medical History:  Diagnosis Date   Asthma    Cholelithiasis without obstruction    Constipation    Environmental and seasonal allergies    OB History  Gravida Para Term Preterm AB Living  5 2 1 1 2 2   SAB IAB Ectopic Multiple Live Births  1 1       # Outcome Date GA Lbr Len/2nd Weight Sex Type Anes PTL Lv  5 Current           4 SAB 03/2024          3 IAB           2 Term  [redacted]w[redacted]d    Vag-Spont     1 Preterm  [redacted]w[redacted]d    Vag-Spont      Past Surgical History:  Procedure Laterality Date   CHOLECYSTECTOMY N/A 11/29/2023   Procedure: LAPAROSCOPIC CHOLECYSTECTOMY;  Surgeon: Kinsinger, Herlene Righter, MD;  Location: [facility] OR;  Service: General;  Laterality: N/A;   Family History  Problem Relation Age of Onset   Multiple sclerosis Mother    Cholelithiasis Maternal Grandmother    Social History   Tobacco Use   Smoking status: Never   Smokeless tobacco: Never  Vaping Use   Vaping status: Never Used  Substance Use Topics   Alcohol use: Not Currently    Comment: occ   Drug use: Yes    Types: Marijuana    Comment: July 2025   No Known Allergies No medications prior to admission.   I have reviewed patient's Past Medical Hx, Surgical Hx, Family Hx, Social Hx, medications and allergies.   ROS  Pertinent items noted in HPI and remainder of comprehensive ROS otherwise negative.   PHYSICAL EXAM  Patient Vitals for the past 24 hrs:   BP Temp Pulse Resp  08/12/24 1424 121/69 -- 85 18  08/12/24 1012 (!) 148/88 98.1 F (36.7 C) 81 18   Constitutional: Well-developed, well-nourished female in no acute distress.  Cardiovascular: normal rate & rhythm, warm and well-perfused Respiratory: normal effort, no problems with respiration noted GI: Abd soft, non-tender, non-distended MS: Extremities nontender, no edema, normal ROM Neurologic: Alert and oriented x 4 Pelvic: deferred   Labs: Results for orders placed or performed during the hospital encounter of 08/12/24 (from the past 24 hours)  CBC     Status: Abnormal   Collection Time: 08/12/24 10:55 AM  Result Value Ref Range   WBC 5.9 4.0 - 10.5 K/uL   RBC 4.64 3.87 - 5.11 MIL/uL   Hemoglobin 11.7 (L) 12.0 - 15.0 g/dL   HCT 63.9 63.9 - 53.9 %   MCV 77.6 (L) 80.0 - 100.0 fL   MCH 25.2 (L) 26.0 - 34.0 pg   MCHC 32.5 30.0 - 36.0 g/dL   RDW 83.7 (H) 88.4 - 84.4 %   Platelets 259 150 - 400 K/uL   nRBC 0.0 0.0 - 0.2 %  ABO/Rh     Status: None   Collection Time: 08/12/24  10:55 AM  Result Value Ref Range   ABO/RH(D) A POS    No rh immune globuloin      NOT A RH IMMUNE GLOBULIN CANDIDATE, PT RH POSITIVE Performed at [facility], [address] [place], [place] [address]   hCG, quantitative, pregnancy     Status: Abnormal   Collection Time: 08/12/24 10:55 AM  Result Value Ref Range   hCG, Beta Chain, Quant, S 91,742 (H) <5 mIU/mL   Imaging:  US  OB Comp Less 14 Wks Result Date: 08/12/2024 EXAM: ULTRASOUND FIRST TRIMESTER CLINICAL HISTORY: Vaginal bleeding affecting early pregnancy. TECHNIQUE: Transabdominal and transvaginal first trimester obstetric pelvic duplex ultrasound was performed with real-time imaging, color flow Doppler imaging, and spectral analysis. COMPARISON: None for this pregnancy. FINDINGS: UTERUS: No focal myometrial mass. GESTATIONAL SAC(S): Single normal appearing gestational sac. A small subchorionic hemorrhage measures 2 mm in maximal width. YOLK  SAC: Not visualized. EMBRYO(<11WK) /FETUS(>=11WK): Single embryo is present. CROWN RUMP LENGTH: 27.2 mm. RATE OF CARDIAC ACTIVITY: Fetal heart rate is 150 beats per minute. RIGHT OVARY: Unremarkable. Normal arterial and venous flow. LEFT OVARY: Unremarkable. Normal arterial and venous flow. FREE FLUID: No free fluid. MEASUREMENTS ESTIMATED GESTATIONAL AGE BY CURRENT ULTRASOUND: 9 weeks 4 days. ESTIMATED GESTATIONAL AGE BY LMP/PRIOR ULTRASOUND: 9 weeks 6 days. LMP is 06/04/24. ESTIMATED DUE DATE: 03/13/25. IMPRESSION: 1. Single intrauterine pregnancy with embryonic cardiac activity and a small subchorionic hemorrhage. 2. Estimated gestational age by current ultrasound is 9 weeks 4 days. Electronically signed by: Lonni Necessary MD 08/12/2024 12:56 PM EDT RP Workstation: [id number]   MDM & MAU COURSE  MDM: moderate  MAU Course: Orders Placed This Encounter  Procedures   US  OB Comp Less 14 Wks   CBC   hCG, quantitative, pregnancy   ABO/Rh   Discharge patient   Meds ordered this encounter  Medications   acetaminophen -caffeine  (EXCEDRIN TENSION HEADACHE) 500-65 MG per tablet 2 tablet   ondansetron  (ZOFRAN -ODT) disintegrating tablet 8 mg   SNM to bedside to review plan of care including US , HCG, CBC, and Excedrin Tension for headache. Patient found to be on toilet in bathroom with moderate amount of blood on pad and quarter sized clot, minimal amount of blood on floor. Assisted patient with cleaning up and moving back to bed. Patient endorses feeling dizzy when ambulating, encouraged pt to call before walking. Reviewed concern for miscarriage v ectopic pregnancy; pt confirms IUP seen on US  at OB/GYN.   Reported nausea after returning from radiology, given Zofran .  Hgb 11.7. US  shows IUP measuring [redacted]w[redacted]d with FHR 150, small subchorionic hemorrhage present. Results and warning signs reviewed with patient. Recommended pelvic rest for 2 weeks. Headache now resolved. Still feeling nauseas but is no longer  vomiting and was able to eat crackers and ginger ale. Plan to continue home nausea meds and follow-up with OB/GYN. BP initially elevated, normal at discharge.  ASSESSMENT   1. Subchorionic hematoma in first trimester, single or unspecified fetus   2. Pregnancy headache in first trimester   3. Nausea and vomiting during pregnancy   4. [redacted] weeks gestation of pregnancy    PLAN  Discharge home in stable condition with return precautions.    Follow-up Information     [facility] Follow up.   Why: as scheduled for ongoing prenatal care                Allergies as of 08/12/2024   No Known Allergies      Medication List  STOP taking these medications    ondansetron  4 MG disintegrating tablet Commonly known as: ZOFRAN -ODT   pantoprazole  40 MG tablet Commonly known as: Protonix        TAKE these medications    acetaminophen -caffeine  500-65 MG Tabs per tablet Commonly known as: EXCEDRIN TENSION HEADACHE Take 2 tablets by mouth as needed (For  headaches).   famotidine  20 MG tablet Commonly known as: PEPCID  Take 1 tablet (20 mg total) by mouth 2 (two) times daily.   multivitamin-prenatal 27-0.8 MG Tabs tablet Take 1 tablet by mouth daily at 12 noon.   ondansetron  4 MG tablet Commonly known as: Zofran  Take 2 tablets (8 mg total) by mouth 2 (two) times daily.   promethazine  12.5 MG tablet Commonly known as: PHENERGAN  Take 12.5 mg by mouth every 6 (six) hours as needed for nausea or vomiting.   scopolamine  1 MG/3DAYS Commonly known as: TRANSDERM-SCOP Place 1 patch (1 mg total) onto the skin every 3 (three) days.       Vernell Ruddle, [facility] 08/12/24 1513  Attestation of Supervision of Student:  I confirm that I have verified the information documented in the nurse midwife student's note and that I have also personally supervised the history, physical exam and all medical decision making activities.  I have verified that all services and findings are  accurately documented in this student's note; and I agree with management and plan as outlined in the documentation. I have also made any necessary editorial changes.  Cornell JONELLE Finder, CNM Center for [employer], [facility] 08/12/2024 9:19 PM

## 2024-08-15 ENCOUNTER — Observation Stay (HOSPITAL_COMMUNITY)
Admission: AD | Admit: 2024-08-15 | Discharge: 2024-08-15 | Disposition: A | Discharge status: Home / Self Care | Payer: Self-pay | Attending: Obstetrics and Gynecology | Admitting: Obstetrics and Gynecology

## 2024-08-15 ENCOUNTER — Observation Stay (HOSPITAL_COMMUNITY): Admitting: Certified Registered Nurse Anesthetist

## 2024-08-15 ENCOUNTER — Encounter (HOSPITAL_COMMUNITY)
Admission: AD | Disposition: A | Discharge status: Home / Self Care | Payer: Self-pay | Source: Home / Self Care | Attending: Obstetrics & Gynecology

## 2024-08-15 ENCOUNTER — Encounter (HOSPITAL_COMMUNITY): Payer: Self-pay | Admitting: Obstetrics & Gynecology

## 2024-08-15 ENCOUNTER — Inpatient Hospital Stay (HOSPITAL_COMMUNITY)

## 2024-08-15 ENCOUNTER — Other Ambulatory Visit: Payer: Self-pay

## 2024-08-15 DIAGNOSIS — F1290 Cannabis use, unspecified, uncomplicated: Secondary | ICD-10-CM | POA: Diagnosis not present

## 2024-08-15 DIAGNOSIS — O034 Incomplete spontaneous abortion without complication: Principal | ICD-10-CM | POA: Insufficient documentation

## 2024-08-15 DIAGNOSIS — O021 Missed abortion: Secondary | ICD-10-CM | POA: Diagnosis not present

## 2024-08-15 DIAGNOSIS — Z9889 Other specified postprocedural states: Secondary | ICD-10-CM

## 2024-08-15 DIAGNOSIS — Z3A10 10 weeks gestation of pregnancy: Secondary | ICD-10-CM | POA: Diagnosis not present

## 2024-08-15 DIAGNOSIS — Z3A00 Weeks of gestation of pregnancy not specified: Secondary | ICD-10-CM | POA: Diagnosis not present

## 2024-08-15 DIAGNOSIS — J45909 Unspecified asthma, uncomplicated: Secondary | ICD-10-CM | POA: Diagnosis not present

## 2024-08-15 HISTORY — PX: DILATION AND EVACUATION: SHX1459

## 2024-08-15 LAB — COMPREHENSIVE METABOLIC PANEL WITH GFR
ALT: 18 U/L (ref 0–44)
AST: 22 U/L (ref 15–41)
Albumin: 3.2 g/dL — ABNORMAL LOW (ref 3.5–5.0)
Alkaline Phosphatase: 50 U/L (ref 38–126)
Anion gap: 11 (ref 5–15)
BUN: 7 mg/dL (ref 6–20)
CO2: 21 mmol/L — ABNORMAL LOW (ref 22–32)
Calcium: 8.9 mg/dL (ref 8.9–10.3)
Chloride: 104 mmol/L (ref 98–111)
Creatinine, Ser: 0.77 mg/dL (ref 0.44–1.00)
GFR, Estimated: >60 mL/min (ref >60)
Glucose, Bld: 89 mg/dL (ref 70–99)
Potassium: 3.7 mmol/L (ref 3.5–5.1)
Sodium: 136 mmol/L (ref 135–145)
Total Bilirubin: 0.3 mg/dL (ref 0.0–1.2)
Total Protein: 7.3 g/dL (ref 6.5–8.1)

## 2024-08-15 LAB — CBC
HCT: 34.1 % — ABNORMAL LOW (ref 36.0–46.0)
HCT: 30.4 % — ABNORMAL LOW (ref 36.0–46.0)
HCT: 30.6 % — ABNORMAL LOW (ref 36.0–46.0)
Hemoglobin: 10.8 g/dL — ABNORMAL LOW (ref 12.0–15.0)
Hemoglobin: 9.9 g/dL — ABNORMAL LOW (ref 12.0–15.0)
Hemoglobin: 9.9 g/dL — ABNORMAL LOW (ref 12.0–15.0)
MCH: 24.8 pg — ABNORMAL LOW (ref 26.0–34.0)
MCH: 25.8 pg — ABNORMAL LOW (ref 26.0–34.0)
MCH: 25.4 pg — ABNORMAL LOW (ref 26.0–34.0)
MCHC: 31.7 g/dL (ref 30.0–36.0)
MCHC: 32.6 g/dL (ref 30.0–36.0)
MCHC: 32.4 g/dL (ref 30.0–36.0)
MCV: 78.2 fL — ABNORMAL LOW (ref 80.0–100.0)
MCV: 79.4 fL — ABNORMAL LOW (ref 80.0–100.0)
MCV: 78.7 fL — ABNORMAL LOW (ref 80.0–100.0)
Platelets: 267 K/uL (ref 150–400)
Platelets: 259 K/uL (ref 150–400)
Platelets: 252 K/uL (ref 150–400)
RBC: 4.36 MIL/uL (ref 3.87–5.11)
RBC: 3.83 MIL/uL — ABNORMAL LOW (ref 3.87–5.11)
RBC: 3.89 MIL/uL (ref 3.87–5.11)
RDW: 16.2 % — ABNORMAL HIGH (ref 11.5–15.5)
RDW: 16.3 % — ABNORMAL HIGH (ref 11.5–15.5)
RDW: 16.2 % — ABNORMAL HIGH (ref 11.5–15.5)
WBC: 6 K/uL (ref 4.0–10.5)
WBC: 6.4 K/uL (ref 4.0–10.5)
WBC: 7.3 K/uL (ref 4.0–10.5)
nRBC: 0 % (ref 0.0–0.2)
nRBC: 0 % (ref 0.0–0.2)
nRBC: 0 % (ref 0.0–0.2)

## 2024-08-15 LAB — TYPE AND SCREEN
ABO/RH(D): A POS
Antibody Screen: NEGATIVE

## 2024-08-15 LAB — DIC (DISSEMINATED INTRAVASCULAR COAGULATION)PANEL
D-Dimer, Quant: 1.61 ug{FEU}/mL — ABNORMAL HIGH (ref 0.00–0.50)
Fibrinogen: 509 mg/dL — ABNORMAL HIGH (ref 210–475)
INR: 1.1 (ref 0.8–1.2)
Platelets: 270 K/uL (ref 150–400)
Prothrombin Time: 15.1 s (ref 11.4–15.2)
Smear Review: NONE SEEN
aPTT: 26 s (ref 24–36)

## 2024-08-15 SURGERY — DILATION AND EVACUATION, UTERUS
Anesthesia: General | Site: Uterus

## 2024-08-15 MED ORDER — DOXYCYCLINE HYCLATE 100 MG IV SOLR
200.0000 mg | Freq: Once | INTRAVENOUS | Status: AC
  Administered 2024-08-15: 200 mg via INTRAVENOUS
  Filled 2024-08-15: qty 200

## 2024-08-15 MED ORDER — LACTATED RINGERS IV SOLN: INTRAVENOUS | Status: DC | PRN

## 2024-08-15 MED ORDER — CHLORHEXIDINE GLUCONATE 0.12 % MT SOLN: 15.0000 mL | Freq: Once | OROMUCOSAL | Status: AC

## 2024-08-15 MED ORDER — SUCCINYLCHOLINE CHLORIDE 200 MG/10ML IV SOSY
PREFILLED_SYRINGE | INTRAVENOUS | Status: DC | PRN
  Administered 2024-08-15: 100 mg via INTRAVENOUS

## 2024-08-15 MED ORDER — LIDOCAINE 2% (20 MG/ML) 5 ML SYRINGE
INTRAMUSCULAR | Status: DC | PRN
  Administered 2024-08-15: 5 mL via INTRAVENOUS

## 2024-08-15 MED ORDER — FENTANYL CITRATE (PF) 100 MCG/2ML IJ SOLN: 25.0000 ug | INTRAMUSCULAR | Status: DC | PRN

## 2024-08-15 MED ORDER — METHYLERGONOVINE MALEATE 0.2 MG/ML IJ SOLN
INTRAMUSCULAR | Status: AC
  Filled 2024-08-15: qty 1

## 2024-08-15 MED ORDER — LACTATED RINGERS IV SOLN: INTRAVENOUS | Status: DC

## 2024-08-15 MED ORDER — ACETAMINOPHEN 10 MG/ML IV SOLN
1000.0000 mg | Freq: Once | INTRAVENOUS | Status: AC
  Administered 2024-08-15: 1000 mg via INTRAVENOUS

## 2024-08-15 MED ORDER — DOXYCYCLINE HYCLATE 100 MG IV SOLR
200.0000 mg | Freq: Once | INTRAVENOUS | Status: DC
  Filled 2024-08-15: qty 200

## 2024-08-15 MED ORDER — 0.9 % SODIUM CHLORIDE (POUR BTL) OPTIME
TOPICAL | Status: DC | PRN
  Administered 2024-08-15: 1000 mL

## 2024-08-15 MED ORDER — POLYETHYLENE GLYCOL 3350 17 G PO PACK: 17.0000 g | PACK | Freq: Every day | ORAL | 1 refills | Status: AC | PRN

## 2024-08-15 MED ORDER — CHLORHEXIDINE GLUCONATE 0.12 % MT SOLN
OROMUCOSAL | Status: AC
  Administered 2024-08-15: 15 mL via OROMUCOSAL
  Filled 2024-08-15: qty 15

## 2024-08-15 MED ORDER — CARBOPROST TROMETHAMINE 250 MCG/ML IM SOLN
INTRAMUSCULAR | Status: AC
  Filled 2024-08-15: qty 1

## 2024-08-15 MED ORDER — LIDOCAINE 2% (20 MG/ML) 5 ML SYRINGE
INTRAMUSCULAR | Status: AC
  Filled 2024-08-15: qty 5

## 2024-08-15 MED ORDER — OXYCODONE HCL 5 MG/5ML PO SOLN: 5.0000 mg | Freq: Once | ORAL | Status: DC | PRN

## 2024-08-15 MED ORDER — PROPOFOL 10 MG/ML IV BOLUS
INTRAVENOUS | Status: DC | PRN
  Administered 2024-08-15: 200 mg via INTRAVENOUS

## 2024-08-15 MED ORDER — PHENOL 1.4 % MT LIQD
1.0000 | OROMUCOSAL | Status: DC | PRN
  Administered 2024-08-15: 1 via OROMUCOSAL
  Filled 2024-08-15: qty 177

## 2024-08-15 MED ORDER — OXYCODONE HCL 5 MG PO TABS
10.0000 mg | ORAL_TABLET | Freq: Once | ORAL | Status: AC
  Administered 2024-08-15: 10 mg via ORAL
  Filled 2024-08-15: qty 2

## 2024-08-15 MED ORDER — IBUPROFEN 600 MG PO TABS: 600.0000 mg | ORAL_TABLET | Freq: Four times a day (QID) | ORAL | Status: DC

## 2024-08-15 MED ORDER — FERROUS SULFATE 324 MG PO TBEC: 324.0000 mg | DELAYED_RELEASE_TABLET | ORAL | 0 refills | Status: AC

## 2024-08-15 MED ORDER — FENTANYL CITRATE (PF) 100 MCG/2ML IJ SOLN
50.0000 ug | Freq: Once | INTRAMUSCULAR | Status: AC
  Administered 2024-08-15: 50 ug via INTRAVENOUS
  Filled 2024-08-15: qty 2

## 2024-08-15 MED ORDER — ORAL CARE MOUTH RINSE: 15.0000 mL | Freq: Once | OROMUCOSAL | Status: AC

## 2024-08-15 MED ORDER — DEXAMETHASONE SODIUM PHOSPHATE 10 MG/ML IJ SOLN
INTRAMUSCULAR | Status: DC | PRN
  Administered 2024-08-15: 10 mg via INTRAVENOUS

## 2024-08-15 MED ORDER — FENTANYL CITRATE (PF) 100 MCG/2ML IJ SOLN
100.0000 ug | INTRAMUSCULAR | Status: DC | PRN
  Administered 2024-08-15: 100 ug via INTRAVENOUS
  Filled 2024-08-15: qty 2

## 2024-08-15 MED ORDER — OXYCODONE HCL 5 MG PO TABS: 5.0000 mg | ORAL_TABLET | Freq: Once | ORAL | Status: DC | PRN

## 2024-08-15 MED ORDER — TRANEXAMIC ACID-NACL 1000-0.7 MG/100ML-% IV SOLN
1000.0000 mg | Freq: Once | INTRAVENOUS | Status: DC
  Filled 2024-08-15: qty 100

## 2024-08-15 MED ORDER — HEATING PAD PADS: MEDICATED_PAD | Status: DC

## 2024-08-15 MED ORDER — MISOPROSTOL 200 MCG PO TABS
400.0000 ug | ORAL_TABLET | Freq: Once | ORAL | Status: AC
  Administered 2024-08-15: 400 ug via BUCCAL
  Filled 2024-08-15: qty 2

## 2024-08-15 MED ORDER — ONDANSETRON HCL 4 MG/2ML IJ SOLN
INTRAMUSCULAR | Status: DC | PRN
  Administered 2024-08-15: 4 mg via INTRAVENOUS

## 2024-08-15 MED ORDER — LIDOCAINE HCL (PF) 1 % IJ SOLN
INTRAMUSCULAR | Status: AC
  Filled 2024-08-15: qty 30

## 2024-08-15 MED ORDER — ONDANSETRON HCL 4 MG/2ML IJ SOLN: 4.0000 mg | Freq: Four times a day (QID) | INTRAMUSCULAR | Status: DC | PRN

## 2024-08-15 MED ORDER — LORAZEPAM 2 MG/ML IJ SOLN
0.5000 mg | Freq: Once | INTRAMUSCULAR | Status: AC
  Administered 2024-08-15: 0.5 mg via INTRAVENOUS
  Filled 2024-08-15: qty 1

## 2024-08-15 MED ORDER — LACTATED RINGERS IV BOLUS
1000.0000 mL | Freq: Once | INTRAVENOUS | Status: AC
  Administered 2024-08-15: 1000 mL via INTRAVENOUS

## 2024-08-15 MED ORDER — MIDAZOLAM HCL 2 MG/2ML IJ SOLN
INTRAMUSCULAR | Status: DC | PRN
  Administered 2024-08-15: 2 mg via INTRAVENOUS

## 2024-08-15 MED ORDER — MIDAZOLAM HCL 2 MG/2ML IJ SOLN
INTRAMUSCULAR | Status: AC
  Filled 2024-08-15: qty 2

## 2024-08-15 MED ORDER — OXYTOCIN-SODIUM CHLORIDE 30-0.9 UT/500ML-% IV SOLN
INTRAVENOUS | Status: AC
Start: 2024-08-15 — End: 2024-08-15
  Filled 2024-08-15: qty 500

## 2024-08-15 MED ORDER — PROPOFOL 10 MG/ML IV BOLUS
INTRAVENOUS | Status: AC
  Filled 2024-08-15: qty 20

## 2024-08-15 MED ORDER — IBUPROFEN 200 MG PO TABS: 600.0000 mg | ORAL_TABLET | Freq: Four times a day (QID) | ORAL | Status: DC | PRN

## 2024-08-15 MED ORDER — FENTANYL CITRATE (PF) 250 MCG/5ML IJ SOLN
INTRAMUSCULAR | Status: AC
  Filled 2024-08-15: qty 5

## 2024-08-15 MED ORDER — LIDOCAINE HCL 1 % IJ SOLN
INTRAMUSCULAR | Status: DC | PRN
  Administered 2024-08-15: 20 mL

## 2024-08-15 MED ORDER — MISOPROSTOL 200 MCG PO TABS
ORAL_TABLET | ORAL | Status: AC
  Filled 2024-08-15: qty 5

## 2024-08-15 MED ORDER — KETOROLAC TROMETHAMINE 30 MG/ML IJ SOLN
30.0000 mg | Freq: Once | INTRAMUSCULAR | Status: AC
  Administered 2024-08-15: 30 mg via INTRAVENOUS
  Filled 2024-08-15: qty 1

## 2024-08-15 MED ORDER — LORAZEPAM 2 MG/ML IJ SOLN: 0.5000 mg | Freq: Once | INTRAMUSCULAR | Status: DC

## 2024-08-15 MED ORDER — KETOROLAC TROMETHAMINE 30 MG/ML IJ SOLN
INTRAMUSCULAR | Status: DC | PRN
  Administered 2024-08-15: 30 mg via INTRAVENOUS

## 2024-08-15 MED ORDER — SUCCINYLCHOLINE CHLORIDE 200 MG/10ML IV SOSY
PREFILLED_SYRINGE | INTRAVENOUS | Status: AC
  Filled 2024-08-15: qty 10

## 2024-08-15 MED ORDER — FENTANYL CITRATE (PF) 250 MCG/5ML IJ SOLN
INTRAMUSCULAR | Status: DC | PRN
  Administered 2024-08-15 (×2): 100 ug via INTRAVENOUS

## 2024-08-15 MED ORDER — ACETAMINOPHEN 10 MG/ML IV SOLN
INTRAVENOUS | Status: AC
  Filled 2024-08-15: qty 100

## 2024-08-15 SURGICAL SUPPLY — 22 items
CATH ROBINSON RED A/P 16FR (CATHETERS) IMPLANT
COVER MAYO STAND STRL (DRAPES) ×1 IMPLANT
FILTER UTR ASPR ASSEMBLY (MISCELLANEOUS) ×1 IMPLANT
GLOVE BIOGEL PI IND STRL 7.0 (GLOVE) ×1 IMPLANT
GLOVE BIOGEL PI IND STRL 7.5 (GLOVE) ×1 IMPLANT
GLOVE NEODERM STER SZ 7 (GLOVE) ×1 IMPLANT
GOWN STRL REUS W/ TWL LRG LVL3 (GOWN DISPOSABLE) ×1 IMPLANT
GOWN STRL REUS W/ TWL XL LVL3 (GOWN DISPOSABLE) ×1 IMPLANT
HOSE CONNECTING 18IN BERKELEY (TUBING) ×1 IMPLANT
KIT BERKELEY 1ST TRI 3/8 NO TR (MISCELLANEOUS) ×1 IMPLANT
KIT BERKELEY 1ST TRIMESTER 3/8 (MISCELLANEOUS) ×1 IMPLANT
NS IRRIG 1000ML POUR BTL (IV SOLUTION) ×1 IMPLANT
PACK VAGINAL MINOR WOMEN LF (CUSTOM PROCEDURE TRAY) ×1 IMPLANT
PAD OB MATERNITY 11 LF (PERSONAL CARE ITEMS) ×1 IMPLANT
SET BERKELEY SUCTION TUBING (SUCTIONS) ×1 IMPLANT
SPIKE FLUID TRANSFER (MISCELLANEOUS) ×1 IMPLANT
TOWEL GREEN STERILE FF (TOWEL DISPOSABLE) ×2 IMPLANT
VACURETTE 10 RIGID CVD (CANNULA) IMPLANT
VACURETTE 12 RIGID CVD (CANNULA) IMPLANT
VACURETTE 7MM CVD STRL WRAP (CANNULA) IMPLANT
VACURETTE 8 RIGID CVD (CANNULA) IMPLANT
VACURETTE 9 RIGID CVD (CANNULA) IMPLANT
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
## 2024-08-15 NOTE — Progress Notes (Signed)
 Attempted to call mom x2 on cell phone went straight to voicemail.  Voicemail left with PACU phone number no callback.

## 2024-08-15 NOTE — MAU Note (Signed)
 Leslie Henderson is a 28 y.o. at [redacted]w[redacted]d here in MAU reporting: via EMS miscarriage at home with significant bleeding noted. Fetus in bed with pt.  Onset of complaint: 0800 Pain score: 800 BP132/42 P97   FHT: na  Lab orders placed from triage: md

## 2024-08-15 NOTE — Transfer of Care (Signed)
 Immediate Anesthesia Transfer of Care Note  Patient: Leslie Henderson  Procedure(s) Performed: DILATION AND EVACUATION, UTERUS  Patient Location: PACU  Anesthesia Type:General  Level of Consciousness: awake, alert , and oriented  Airway & Oxygen Therapy: Patient Spontanous Breathing and Patient connected to nasal cannula oxygen  Post-op Assessment: Report given to RN and Post -op Vital signs reviewed and stable  Post vital signs: Reviewed and stable  Last Vitals:  Vitals Value Taken Time  BP 129/90 08/15/24 20:15  Temp 37.2 C 08/15/24 20:07  Pulse 107 08/15/24 20:17  Resp 13 08/15/24 20:17  SpO2 99 % 08/15/24 20:17  Vitals shown include unfiled device data.  Last Pain:  Vitals:   08/15/24 2007  TempSrc:   PainSc: 0-No pain      Patients Stated Pain Goal: 0 (08/15/24 1820)  Complications: No notable events documented.

## 2024-08-15 NOTE — Op Note (Signed)
 Operative Note   08/15/2024  PRE-OP DIAGNOSIS: retained products of conception after manual vacuum aspiration in OB triage. Incomplete miscarriage at 10 weeks   POST-OP DIAGNOSIS: Same  SURGEON: Surgeons and Role:    DEWAINE Izell Harari, MD - Primary  ASSISTANT: None  PROCEDURE:  Suction dilation and curettage  ANESTHESIA: General and paracervical block  ESTIMATED BLOOD LOSS: 10mL  DRAINS: I/O cath done for no UOP  TOTAL IV FLUIDS: per OR note  SPECIMENS: products of conception to pathology  VTE PROPHYLAXIS: SCDs to the bilateral lower extremities  ANTIBIOTICS: Doxycycline  200mg  IV x 1 pre op  COMPLICATIONS: none  DISPOSITION: PACU - hemodynamically stable.  CONDITION: stable  BLOOD TYPE: A POS. Rhogam given:not applicable  FINDINGS: Exam under anesthesia revealed 8-10 week sized uterus with no masses and bilateral adnexa without masses or fullness. Moderate amount of  products of conception were seen, with gritty texture in all four quadrants.  PROCEDURE IN DETAIL:  After informed consent was obtained, the patient was taken to the operating room where anesthesia was obtained without difficulty. The patient was positioned in the dorsal lithotomy position in [place] stirrups. The patient was examined under anesthesia, with the above noted findings.  The bi-valved speculum was placed inside the patient's vagina, and the the anterior lip of the cervix was seen and grasped with the tenaculum.  A paracervical block was achieved with 20mL of 1% lidocaine  and then the cervix was already dilated to pass a #12 cannula.  The suction was then calibrated to and connected to the number 12 cannula, which was then introduced with the above noted findings. A gentle curettage was done at the end and yield no products of conception.   The suction was then done one more time to remove any remaining curettage material.   Excellent hemostasis was noted, and all instruments were removed, with  excellent hemostasis noted throughout.  She was then taken out of dorsal lithotomy. The patient tolerated the procedure well.  Sponge, lap and instrument counts were correct x2.  The patient was taken to recovery room in excellent condition.  Harari Izell Raddle MD Attending Center for [employer] Midwife)

## 2024-08-15 NOTE — Anesthesia Preprocedure Evaluation (Signed)
 Anesthesia Evaluation  Patient identified by MRN, date of birth, ID band Patient awake    Reviewed: Allergy & Precautions, H&P , NPO status , Patient's Chart, lab work & pertinent test results  Airway Mallampati: II   Neck ROM: full    Dental   Pulmonary asthma    breath sounds clear to auscultation       Cardiovascular negative cardio ROS  Rhythm:regular Rate:Normal     Neuro/Psych    GI/Hepatic   Endo/Other    Renal/GU      Musculoskeletal   Abdominal   Peds  Hematology   Anesthesia Other Findings   Reproductive/Obstetrics Missed AB                              Anesthesia Physical Anesthesia Plan  ASA: 2  Anesthesia Plan: General   Post-op Pain Management:    Induction: Intravenous  PONV Risk Score and Plan: 3 and Ondansetron , Dexamethasone , Midazolam  and Treatment may vary due to age or medical condition  Airway Management Planned: Oral ETT  Additional Equipment:   Intra-op Plan:   Post-operative Plan: Extubation in OR  Informed Consent: I have reviewed the patients History and Physical, chart, labs and discussed the procedure including the risks, benefits and alternatives for the proposed anesthesia with the patient or authorized representative who has indicated his/her understanding and acceptance.     Dental advisory given  Plan Discussed with: CRNA, Anesthesiologist and Surgeon  Anesthesia Plan Comments:         Anesthesia Quick Evaluation
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
## 2024-08-15 NOTE — Discharge Instructions (Addendum)
   We will discuss your surgery once again in detail at your post-op visit in two to four weeks. If you haven't already done so, please call to make your appointment as soon as possible.  Dilation and Curettage or Vacuum Curettage, Care After These instructions give you information on caring for yourself after your procedure. Your doctor may also give you more specific instructions. Call your doctor if you have any problems or questions after your procedure. HOME CARE Do not drive for 24 hours. Wait 1 week before doing any activities that wear you out. Do not stand for a long time. Limit stair climbing to once or twice a day. Rest often. Continue with your usual diet. Drink enough fluids to keep your pee (urine) clear or pale yellow. If you have a hard time pooping (constipation), you may: Take a medicine to help you go poop (laxative) as told by your doctor. Eat more fruit and bran. Drink more fluids. Take showers, not baths, for as long as told by your doctor. Do not swim or use a hot tub until your doctor says it is okay. Have someone with you for 1day after the procedure. Do not douche, use tampons, or have sex (intercourse) until seen by your doctor Only take medicines as told by your doctor. Do not take aspirin. It can cause bleeding. Keep all doctor visits. GET HELP IF: You have cramps or pain not helped by medicine. You have new pain in the belly (abdomen). You have a bad smelling fluid coming from your vagina. You have a rash. You have problems with any medicine. GET HELP RIGHT AWAY IF:  You start to bleed more than a regular period. You have a fever. You have chest pain. You have trouble breathing. You feel dizzy or feel like passing out (fainting). You pass out. You have pain in the tops of your shoulders. You have vaginal bleeding with or without clumps of blood (blood clots). MAKE SURE YOU: Understand these instructions. Will watch your condition. Will get help  right away if you are not doing well or get worse. Document Released: 09/06/2008 Document Revised: 12/03/2013 Document Reviewed: 06/27/2013 Exit[employer] Patient Information 2015 [employer]. This information is not intended to replace advice given to you by your health care provider. Make sure you discuss any questions you have with your health care provider.   

## 2024-08-16 ENCOUNTER — Encounter (HOSPITAL_COMMUNITY): Payer: Self-pay | Admitting: Obstetrics and Gynecology
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
## 2024-08-19 ENCOUNTER — Other Ambulatory Visit: Payer: Self-pay | Admitting: Obstetrics and Gynecology

## 2024-08-19 LAB — SURGICAL PATHOLOGY

## 2024-08-20 LAB — SURGICAL PATHOLOGY
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
## 2024-08-27 NOTE — Anesthesia Postprocedure Evaluation (Signed)
 Anesthesia Post Note  Patient: Leslie Henderson  Procedure(s) Performed: SUCTION DILATION AND EVACUATION, UTERUS (Uterus)     Patient location during evaluation: PACU Anesthesia Type: General Level of consciousness: awake and alert Pain management: pain level controlled Vital Signs Assessment: post-procedure vital signs reviewed and stable Respiratory status: spontaneous breathing, nonlabored ventilation, respiratory function stable and patient connected to nasal cannula oxygen Cardiovascular status: blood pressure returned to baseline and stable Postop Assessment: no apparent nausea or vomiting Anesthetic complications: no   No notable events documented.  Last Vitals:  Vitals:   08/15/24 2109 08/15/24 2214  BP: (!) 125/56 (!) 123/58  Pulse: 86 90  Resp: 14 15  Temp: 36.9 C   SpO2: 100% 100%    Last Pain:  Vitals:   08/15/24 2214  TempSrc:   PainSc: 7                  Raynald Rouillard S

## 2024-09-02 ENCOUNTER — Ambulatory Visit: Admitting: Obstetrics and Gynecology

## 2024-11-04 ENCOUNTER — Ambulatory Visit: Admitting: Physician Assistant

## 2024-11-04 NOTE — Progress Notes (Deleted)
 11/04/2024 Leslie Henderson [id number] 18-Feb-1996  Referring provider: No ref. provider found Primary GI doctor: {[email or web address]:[id number]}  ASSESSMENT AND PLAN:  History of cholecystitis Status post laparoscopic cholecystectomy 11/29/2023  Hepatic steatosis Seen on RUQ US  10/2022  Constipation  Anemia secondary to vaginal bleeding  Patient Care Team: Patient, No Pcp Per as PCP - General (General Practice)  HISTORY OF PRESENT ILLNESS: 28 y.o. female with a past medical history listed below presents for evaluation of ***.   *** Discussed the use of AI scribe software for clinical note transcription with the patient, who gave verbal consent to proceed.  History of Present Illness            She  reports that she has never smoked. She has never used smokeless tobacco. She reports that she does not currently use alcohol. She reports current drug use. Drug: Marijuana.  RELEVANT GI HISTORY, IMAGING AND LABS: Results          CBC    Component Value Date/Time   WBC 7.3 08/15/2024 1737   RBC 3.89 08/15/2024 1737   HGB 9.9 (L) 08/15/2024 1737   HCT 30.6 (L) 08/15/2024 1737   PLT 252 08/15/2024 1737   MCV 78.7 (L) 08/15/2024 1737   MCH 25.4 (L) 08/15/2024 1737   MCHC 32.4 08/15/2024 1737   RDW 16.2 (H) 08/15/2024 1737   LYMPHSABS 2.2 11/28/2023 1925   MONOABS 1.2 (H) 11/28/2023 1925   EOSABS 0.1 11/28/2023 1925   BASOSABS 0.0 11/28/2023 1925   Recent Labs    11/28/23 1925 11/29/23 0914 02/20/24 1609 03/13/24 1357 03/30/24 1222 08/02/24 1133 08/12/24 1055 08/15/24 1126 08/15/24 1319 08/15/24 1737  HGB 11.9* 11.6* 12.5 13.4 11.6* 12.3 11.7* 10.8* 9.9* 9.9*    CMP     Component Value Date/Time   NA 136 08/15/2024 1126   K 3.7 08/15/2024 1126   CL 104 08/15/2024 1126   CO2 21 (L) 08/15/2024 1126   GLUCOSE 89 08/15/2024 1126   BUN 7 08/15/2024 1126   CREATININE 0.77 08/15/2024 1126   CALCIUM 8.9 08/15/2024 1126   PROT 7.3 08/15/2024 1126   ALBUMIN 3.2 (L)  08/15/2024 1126   AST 22 08/15/2024 1126   ALT 18 08/15/2024 1126   ALKPHOS 50 08/15/2024 1126   BILITOT 0.3 08/15/2024 1126   GFRNONAA >60 08/15/2024 1126      Latest Ref Rng & Units 08/15/2024   11:26 AM 08/02/2024   11:33 AM 03/30/2024   12:22 PM  Hepatic Function  Total Protein 6.5 - 8.1 g/dL 7.3  7.7  7.2   Albumin 3.5 - 5.0 g/dL 3.2  3.4  3.0   AST 15 - 41 U/L 22  22  25    ALT 0 - 44 U/L 18  20  20    Alk Phosphatase 38 - 126 U/L 50  50  47   Total Bilirubin 0.0 - 1.2 mg/dL 0.3  0.5  0.3       Current Medications:     Current Outpatient Medications (Respiratory):    promethazine  (PHENERGAN ) 12.5 MG tablet, Take 12.5 mg by mouth every 6 (six) hours as needed for nausea or vomiting.  Current Outpatient Medications (Analgesics):    acetaminophen -caffeine  (EXCEDRIN TENSION HEADACHE) 500-65 MG TABS per tablet, Take 2 tablets by mouth as needed (For  headaches).   ibuprofen  (MOTRIN  IB) 200 MG tablet, Take 3 tablets (600 mg total) by mouth every 6 (six) hours as needed for mild pain (pain score 1-3), cramping, headache  or moderate pain (pain score 4-6).  Current Outpatient Medications (Hematological):    ferrous sulfate  324 MG TBEC, Take 1 tablet (324 mg total) by mouth every other day for 30 doses.  Current Outpatient Medications (Other):    famotidine  (PEPCID ) 20 MG tablet, Take 1 tablet (20 mg total) by mouth 2 (two) times daily.   Heating Pad PADS, Use as needed   ondansetron  (ZOFRAN ) 4 MG tablet, Take 2 tablets (8 mg total) by mouth 2 (two) times daily.   polyethylene glycol (MIRALAX ) 17 g packet, Take 17 g by mouth daily as needed for moderate constipation or mild constipation.   Prenatal Vit-Fe Fumarate-FA (MULTIVITAMIN-PRENATAL) 27-0.8 MG TABS tablet, Take 1 tablet by mouth daily at 12 noon.   scopolamine  (TRANSDERM-SCOP) 1 MG/3DAYS, Place 1 patch (1 mg total) onto the skin every 3 (three) days.  Medical History:  Past Medical History:  Diagnosis Date   Asthma     Cholelithiasis without obstruction    Constipation    Environmental and seasonal allergies    Allergies: No Known Allergies   Surgical History:  She  has a past surgical history that includes Cholecystectomy (N/A, 11/29/2023) and Dilation and evacuation (N/A, 08/15/2024). Family History:  Her family history includes Cholelithiasis in her maternal grandmother; Multiple sclerosis in her mother.  REVIEW OF SYSTEMS  : All other systems reviewed and negative except where noted in the History of Present Illness.  PHYSICAL EXAM: LMP 06/04/2024  Physical Exam          Alan JONELLE Coombs, PA-C 7:55 AM

## 2024-12-18 NOTE — Progress Notes (Unsigned)
 "    12/18/2024 Leslie Henderson [id number] 1996-11-03  Referring provider: No ref. provider found Primary GI doctor: {[email or web address]:[id number]}  ASSESSMENT AND PLAN:  IDA with D&C 08/15/2024  HGB 9.9 MCV 78.7 Platelets 252 Recent Labs    02/20/24 1609 03/13/24 1357 03/30/24 1222 08/02/24 1133 08/12/24 1055 08/15/24 1126 08/15/24 1319 08/15/24 1737  HGB 12.5 13.4 11.6* 12.3 11.7* 10.8* 9.9* 9.9*   GERD Status post cholecystectomy On Pepcid   Constipation On MiraLAX   History of cholecystitis 11/26/2023 RUQ US  cholelithiasis acute cholecystitis fatty liver  Status post cholecystectomy 11/29/2023  Fatty liver seen on RUQ US  11/2023    Latest Ref Rng & Units 08/15/2024   11:26 AM 08/02/2024   11:33 AM 03/30/2024   12:22 PM  Hepatic Function  Total Protein 6.5 - 8.1 g/dL 7.3  7.7  7.2   Albumin 3.5 - 5.0 g/dL 3.2  3.4  3.0   AST 15 - 41 U/L 22  22  25    ALT 0 - 44 U/L 18  20  20    Alk Phosphatase 38 - 126 U/L 50  50  47   Total Bilirubin 0.0 - 1.2 mg/dL 0.3  0.5  0.3    Platelets 252  INR 08/15/2024 1.1  FIB 4 *** - need LFTs and CBC monitored every 6 months, - evaluation with imaging every 2-3 years.  - Encouraged diet/exercise for modest 10% body weight loss as treatment for hepatic steatosis -Continue to work on risk factor modification including diet exercise and control of risk factors including blood sugars. - cessation of alcohol   Patient Care Team: Patient, No Pcp Per as PCP - General (General Practice)  HISTORY OF PRESENT ILLNESS: 29 y.o. female with a past medical history listed below presents for evaluation of ***.   *** Discussed the use of AI scribe software for clinical note transcription with the patient, who gave verbal consent to proceed.  History of Present Illness            She  reports that she has never smoked. She has never used smokeless tobacco. She reports that she does not currently use alcohol. She reports current drug use. Drug:  Marijuana.  RELEVANT GI HISTORY, IMAGING AND LABS: Results         11/26/2023 RUQ US  1. Cholelithiasis with gallbladder wall thickening. No pericholecystic fluid or sonographic Murphy sign. Findings are equivocal for acute cholecystitis. If there is clinical concern for acute cholecystitis, consider further evaluation with HIDA scan. 2. Increased hepatic parenchymal echogenicity suggestive of steatosis. CBC    Component Value Date/Time   WBC 7.3 08/15/2024 1737   RBC 3.89 08/15/2024 1737   HGB 9.9 (L) 08/15/2024 1737   HCT 30.6 (L) 08/15/2024 1737   PLT 252 08/15/2024 1737   MCV 78.7 (L) 08/15/2024 1737   MCH 25.4 (L) 08/15/2024 1737   MCHC 32.4 08/15/2024 1737   RDW 16.2 (H) 08/15/2024 1737   LYMPHSABS 2.2 11/28/2023 1925   MONOABS 1.2 (H) 11/28/2023 1925   EOSABS 0.1 11/28/2023 1925   BASOSABS 0.0 11/28/2023 1925   Recent Labs    02/20/24 1609 03/13/24 1357 03/30/24 1222 08/02/24 1133 08/12/24 1055 08/15/24 1126 08/15/24 1319 08/15/24 1737  HGB 12.5 13.4 11.6* 12.3 11.7* 10.8* 9.9* 9.9*    CMP     Component Value Date/Time   NA 136 08/15/2024 1126   K 3.7 08/15/2024 1126   CL 104 08/15/2024 1126   CO2 21 (L) 08/15/2024 1126   GLUCOSE 89 08/15/2024  1126   BUN 7 08/15/2024 1126   CREATININE 0.77 08/15/2024 1126   CALCIUM 8.9 08/15/2024 1126   PROT 7.3 08/15/2024 1126   ALBUMIN 3.2 (L) 08/15/2024 1126   AST 22 08/15/2024 1126   ALT 18 08/15/2024 1126   ALKPHOS 50 08/15/2024 1126   BILITOT 0.3 08/15/2024 1126   GFRNONAA >60 08/15/2024 1126      Latest Ref Rng & Units 08/15/2024   11:26 AM 08/02/2024   11:33 AM 03/30/2024   12:22 PM  Hepatic Function  Total Protein 6.5 - 8.1 g/dL 7.3  7.7  7.2   Albumin 3.5 - 5.0 g/dL 3.2  3.4  3.0   AST 15 - 41 U/L 22  22  25    ALT 0 - 44 U/L 18  20  20    Alk Phosphatase 38 - 126 U/L 50  50  47   Total Bilirubin 0.0 - 1.2 mg/dL 0.3  0.5  0.3       Current Medications:   Current Outpatient Medications  (Respiratory):    promethazine  (PHENERGAN ) 12.5 MG tablet, Take 12.5 mg by mouth every 6 (six) hours as needed for nausea or vomiting.  Current Outpatient Medications (Analgesics):    acetaminophen -caffeine  (EXCEDRIN TENSION HEADACHE) 500-65 MG TABS per tablet, Take 2 tablets by mouth as needed (For  headaches).   ibuprofen  (MOTRIN  IB) 200 MG tablet, Take 3 tablets (600 mg total) by mouth every 6 (six) hours as needed for mild pain (pain score 1-3), cramping, headache or moderate pain (pain score 4-6).  Current Outpatient Medications (Hematological):    ferrous sulfate  324 MG TBEC, Take 1 tablet (324 mg total) by mouth every other day for 30 doses.  Current Outpatient Medications (Other):    famotidine  (PEPCID ) 20 MG tablet, Take 1 tablet (20 mg total) by mouth 2 (two) times daily.   Heating Pad PADS, Use as needed   ondansetron  (ZOFRAN ) 4 MG tablet, Take 2 tablets (8 mg total) by mouth 2 (two) times daily.   polyethylene glycol (MIRALAX ) 17 g packet, Take 17 g by mouth daily as needed for moderate constipation or mild constipation.   Prenatal Vit-Fe Fumarate-FA (MULTIVITAMIN-PRENATAL) 27-0.8 MG TABS tablet, Take 1 tablet by mouth daily at 12 noon.   scopolamine  (TRANSDERM-SCOP) 1 MG/3DAYS, Place 1 patch (1 mg total) onto the skin every 3 (three) days.  Medical History:  Past Medical History:  Diagnosis Date   Asthma    Cholelithiasis without obstruction    Constipation    Environmental and seasonal allergies    Allergies: Allergies[1]   Surgical History:  She  has a past surgical history that includes Cholecystectomy (N/A, 11/29/2023) and Dilation and evacuation (N/A, 08/15/2024). Family History:  Her family history includes Cholelithiasis in her maternal grandmother; Multiple sclerosis in her mother.  REVIEW OF SYSTEMS  : All other systems reviewed and negative except where noted in the History of Present Illness.  PHYSICAL EXAM: LMP 06/04/2024  Physical Exam          Alan JONELLE Coombs, PA-C 12:24 PM      [1] No Known Allergies  "

## 2024-12-19 ENCOUNTER — Ambulatory Visit
Admission: EM | Admit: 2024-12-19 | Discharge: 2024-12-19 | Disposition: A | Discharge status: Home / Self Care | Attending: Family Medicine | Admitting: Family Medicine

## 2024-12-19 DIAGNOSIS — Z3201 Encounter for pregnancy test, result positive: Secondary | ICD-10-CM

## 2024-12-19 DIAGNOSIS — R350 Frequency of micturition: Secondary | ICD-10-CM | POA: Insufficient documentation

## 2024-12-19 DIAGNOSIS — Z202 Contact with and (suspected) exposure to infections with a predominantly sexual mode of transmission: Secondary | ICD-10-CM | POA: Diagnosis present

## 2024-12-19 DIAGNOSIS — B009 Herpesviral infection, unspecified: Secondary | ICD-10-CM | POA: Insufficient documentation

## 2024-12-19 DIAGNOSIS — A599 Trichomoniasis, unspecified: Secondary | ICD-10-CM | POA: Diagnosis present

## 2024-12-19 LAB — POCT URINE DIPSTICK
Bilirubin, UA: NEGATIVE
Glucose, UA: NEGATIVE mg/dL
Nitrite, UA: NEGATIVE
Spec Grav, UA: 1.025
Urobilinogen, UA: 0.2 U/dL
pH, UA: 5.5

## 2024-12-19 LAB — POCT URINE PREGNANCY: Preg Test, Ur: POSITIVE — AB

## 2024-12-19 MED ORDER — METRONIDAZOLE 500 MG PO TABS: 500.0000 mg | ORAL_TABLET | Freq: Two times a day (BID) | ORAL | 0 refills | Status: AC

## 2024-12-19 MED ORDER — CLOTRIMAZOLE 1 % VA CREA: 1.0000 | TOPICAL_CREAM | Freq: Every day | VAGINAL | 0 refills | Status: AC

## 2024-12-19 NOTE — ED Provider Notes (Signed)
 " [facility] - [facility]  Note:  This document was prepared using Conservation officer, historic buildings and may include unintentional dictation errors.  MRN: [id number] DOB: April 06, 1996  Subjective:   Leslie Henderson is a 29 y.o. female presenting for STI testing and a UTI check.  LMP was the beginning of December.  Patient was last pregnant and miscarried in September.  Has had urinary frequency.  Her boyfriend tested positive for trichomonas.  Denies fever, n/v, abdominal pain, pelvic pain, rashes, dysuria, hematuria.    Current Outpatient Medications  Medication Instructions   acetaminophen -caffeine  (EXCEDRIN TENSION HEADACHE) 500-65 MG TABS per tablet 2 tablets, Oral, As needed   famotidine  (PEPCID ) 20 mg, Oral, 2 times daily   ferrous sulfate  324 mg, Oral, Every other day   Heating Pad PADS Use as needed   ibuprofen  (MOTRIN  IB) 600 mg, Oral, Every 6 hours PRN   ondansetron  (ZOFRAN ) 8 mg, Oral, 2 times daily   polyethylene glycol (MIRALAX ) 17 g, Oral, Daily PRN   Prenatal Vit-Fe Fumarate-FA (MULTIVITAMIN-PRENATAL) 27-0.8 MG TABS tablet 1 tablet, Daily   promethazine  (PHENERGAN ) 12.5 mg, Every 6 hours PRN   scopolamine  (TRANSDERM-SCOP) 1 mg, Transdermal, every 72 hours    Allergies[1]  Past Medical History:  Diagnosis Date   Asthma    Cholelithiasis without obstruction    Constipation    Environmental and seasonal allergies      Past Surgical History:  Procedure Laterality Date   CHOLECYSTECTOMY N/A 11/29/2023   Procedure: LAPAROSCOPIC CHOLECYSTECTOMY;  Surgeon: Kinsinger, Herlene Righter, MD;  Location: [facility] OR;  Service: General;  Laterality: N/A;   DILATION AND EVACUATION N/A 08/15/2024   Procedure: SUCTION DILATION AND EVACUATION, UTERUS;  Surgeon: Izell Harari, MD;  Location: [facility] OR;  Service: Gynecology;  Laterality: N/A;    Family History  Problem Relation Age of Onset   Multiple sclerosis Mother    Cholelithiasis Maternal Grandmother     Social History    Occupational History   Not on file  Tobacco Use   Smoking status: Never   Smokeless tobacco: Never  Vaping Use   Vaping status: Never Used  Substance and Sexual Activity   Alcohol use: Not Currently    Comment: occ   Drug use: Yes    Types: Marijuana    Comment: July 2025   Sexual activity: Yes    Birth control/protection: None     ROS   Objective:   Vitals: BP 123/74 (BP Location: Left Arm)   Pulse 86   Resp 20   LMP 11/11/2024 (Approximate)   SpO2 98%   Breastfeeding No   Physical Exam Constitutional:      General: She is not in acute distress.    Appearance: Normal appearance. She is well-developed. She is not ill-appearing, toxic-appearing or diaphoretic.  HENT:     Head: Normocephalic and atraumatic.     Nose: Nose normal.     Mouth/Throat:     Mouth: Mucous membranes are moist.  Eyes:     General: No scleral icterus.       Right eye: No discharge.        Left eye: No discharge.     Extraocular Movements: Extraocular movements intact.     Conjunctiva/sclera: Conjunctivae normal.  Cardiovascular:     Rate and Rhythm: Normal rate.  Pulmonary:     Effort: Pulmonary effort is normal.  Abdominal:     General: Bowel sounds are normal. There is no distension.     Palpations: Abdomen  is soft. There is no mass.     Tenderness: There is no abdominal tenderness. There is no right CVA tenderness, left CVA tenderness, guarding or rebound.  Skin:    General: Skin is warm and dry.  Neurological:     General: No focal deficit present.     Mental Status: She is alert and oriented to person, place, and time.  Psychiatric:        Mood and Affect: Mood normal.        Behavior: Behavior normal.        Thought Content: Thought content normal.        Judgment: Judgment normal.     Results for orders placed or performed during the hospital encounter of 12/19/24 (from the past 24 hours)  POCT URINE DIPSTICK     Status: Abnormal   Collection Time: 12/19/24 11:34 AM   Result Value Ref Range   Color, UA yellow yellow   Clarity, UA cloudy (A) clear   Glucose, UA negative negative mg/dL   Bilirubin, UA negative negative   Ketones, POC UA trace (5) (A) negative mg/dL   Spec Grav, UA 8.974 8.989 - 1.025   Blood, UA trace-intact (A) negative   pH, UA 5.5 5.0 - 8.0   POC PROTEIN,UA trace negative, trace   Urobilinogen, UA 0.2 0.2 or 1.0 E.U./dL   Nitrite, UA Negative Negative   Leukocytes, UA Small (1+) (A) Negative  POCT urine pregnancy     Status: Abnormal   Collection Time: 12/19/24 11:34 AM  Result Value Ref Range   Preg Test, Ur Positive (A) Negative    Assessment and Plan :   PDMP not reviewed this encounter.  1. Trichomonal infection   2. Urinary frequency   3. Positive pregnancy test   4. Exposure to trichomonas      Recommended treating empirically for trichomonas infection with metronidazole  and as patient gets yeast infection with antibiotics, recommended clotrimazole  vaginal cream.  Labs pending.  Follow-up and establish with an OB/GYN asap. Counseled patient on potential for adverse effects with medications prescribed/recommended today, ER and return-to-clinic precautions discussed, patient verbalized understanding.     [1] No Known Allergies    Christopher Savannah, PA-C 12/19/24 1431  "

## 2024-12-19 NOTE — ED Triage Notes (Signed)
 Patient presents to [facility] for STD screening. States her BF was exposed to trich. She wants full STD screening. Also concerned with preg., LMP 11/11/24 and urinary freq.

## 2024-12-19 NOTE — Discharge Instructions (Signed)
 Start metronidazole  for the trichomonas exposure and clotrimazole  vaginal cream for antibiotic associated yeast infection. Your pregnancy test was positive and therefore I recommend seeking a consultation with an obstetrician.  Please start a prenatal vitamin.  Avoid any alcohol use.  Do not use any nonsteroidal anti-inflammatories (NSAIDs) like ibuprofen , Motrin , naproxen , Aleve , etc. which are all available over-the-counter.  Please just use Tylenol  at a dose of 500mg -650mg  once every 6 hours as needed for your aches, pains, fevers.

## 2024-12-20 ENCOUNTER — Ambulatory Visit: Admitting: Physician Assistant

## 2024-12-20 ENCOUNTER — Ambulatory Visit (HOSPITAL_COMMUNITY): Payer: Self-pay

## 2024-12-20 LAB — URINE CULTURE

## 2024-12-20 LAB — SYPHILIS: RPR W/REFLEX TO RPR TITER AND TREPONEMAL ANTIBODIES, TRADITIONAL SCREENING AND DIAGNOSIS ALGORITHM: RPR Ser Ql: NONREACTIVE

## 2024-12-20 LAB — CERVICOVAGINAL ANCILLARY ONLY
Chlamydia: NEGATIVE
Comment: NEGATIVE
Comment: NEGATIVE
Comment: NORMAL
Neisseria Gonorrhea: NEGATIVE
Trichomonas: POSITIVE — AB

## 2024-12-20 LAB — HIV ANTIBODY (ROUTINE TESTING W REFLEX): HIV Screen 4th Generation wRfx: NONREACTIVE

## 2024-12-23 ENCOUNTER — Encounter (HOSPITAL_BASED_OUTPATIENT_CLINIC_OR_DEPARTMENT_OTHER): Payer: Self-pay

## 2024-12-23 ENCOUNTER — Ambulatory Visit (INDEPENDENT_AMBULATORY_CARE_PROVIDER_SITE_OTHER)

## 2024-12-23 VITALS — BP 130/72 | HR 91 | Ht 64.0 in | Wt 247.0 lb

## 2024-12-23 DIAGNOSIS — O3680X0 Pregnancy with inconclusive fetal viability, not applicable or unspecified: Secondary | ICD-10-CM

## 2024-12-23 DIAGNOSIS — N912 Amenorrhea, unspecified: Secondary | ICD-10-CM

## 2024-12-23 DIAGNOSIS — R399 Unspecified symptoms and signs involving the genitourinary system: Secondary | ICD-10-CM

## 2024-12-23 DIAGNOSIS — Z3201 Encounter for pregnancy test, result positive: Secondary | ICD-10-CM | POA: Diagnosis not present

## 2024-12-23 DIAGNOSIS — O209 Hemorrhage in early pregnancy, unspecified: Secondary | ICD-10-CM

## 2024-12-23 DIAGNOSIS — Z3A01 Less than 8 weeks gestation of pregnancy: Secondary | ICD-10-CM

## 2024-12-23 DIAGNOSIS — Z3480 Encounter for supervision of other normal pregnancy, unspecified trimester: Secondary | ICD-10-CM | POA: Insufficient documentation

## 2024-12-23 LAB — POCT URINE PREGNANCY: Preg Test, Ur: POSITIVE — AB

## 2024-12-23 LAB — BETA HCG QUANT (REF LAB): hCG Quant: 1323 m[IU]/mL

## 2024-12-23 MED ORDER — BLOOD PRESSURE KIT DEVI: 1.0000 | Freq: Every day | 0 refills | Status: AC

## 2024-12-23 MED ORDER — GOJJI WEIGHT SCALE MISC: 1.0000 | 0 refills | Status: AC

## 2024-12-23 NOTE — Progress Notes (Signed)
 NURSE VISIT- PREGNANCY CONFIRMATION   SUBJECTIVE:  Leslie Henderson is a 29 y.o. [phone] female at [redacted]w[redacted]d by certain LMP. Patient's last menstrual period was 11/16/2024 (exact date). Here for pregnancy confirmation.  Home pregnancy test: positive x 2.  She reports urinary frequency, dysuria, and light pink vaginal bleeding with wiping. Denies any heavy bleeding. She is not taking prenatal vitamins.    I explained I am completing New OB Intake today. We discussed EDD of 08/23/2025 based on LMP of 11/16/2024. Pt is [id number]. I reviewed her allergies, medications and Medical/Surgical/OB history.    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 I explained pt will have some visits in office and some virtually. Babyscripts instructions given and order placed. Patient verifies receipt of registration text/e-mail. Account successfully created and app downloaded. If patient is a candidate for Optimized scheduling, add to sticky note.   Blood Pressure Cuff/Weight Scale Blood pressure cuff ordered for patient to pick-up from [employer]. Explained after first prenatal appt pt will check weekly and document in Babyscripts. Patient does not have weight scale; order sent to [facility], patient may track weight weekly in Babyscripts.  Is patient a candidate for Babyscripts Optimization? Yes, patient accepted    OBJECTIVE:  BP 130/72 (BP Location: Right Arm, Patient Position: Sitting, Cuff Size: Large)   Pulse 91   Ht 5' 4 (1.626 m)   Wt 247 lb (112 kg)   LMP  11/16/2024 (Exact Date)   SpO2 100%   Breastfeeding Unknown   BMI 42.40 kg/m   Appears well, in no apparent distress  Results for orders placed or performed in visit on 12/23/24 (from the past 24 hours)  POCT urine pregnancy   Collection Time: 12/23/24 10:15 AM  Result Value Ref Range   Preg Test, Ur Positive (A) Negative    ASSESSMENT: Positive urine pregnancy test in office today.    PLAN: Prenatal vitamins: plans to begin OTC ASAP   Nausea medicines: not currently needed Urine culture sent today. Beta HCG quant performed today. Repeat lab appointment for follow up beta hcg quant scheduled for 12/25/2024 at 10 am. Advised if she develops any heavy vaginal bleeding, has upper right quadrant abdominal pain, fever, chills, severe nausea/vomiting needs to be seen at [facility] for evaluation. New OB packet provided and reviewed with patient. Advised to return completed paperwork at new OB appointment. New OB appointment scheduled for 02/11/2025 at 10:35 am with Nidia Daring, NP. Viability scan ordered and scheduled for 01/15/2025 at 10:30 am.   Keaten Mashek E, RN 12/23/2024  10:57 AM

## 2024-12-24 ENCOUNTER — Ambulatory Visit (HOSPITAL_BASED_OUTPATIENT_CLINIC_OR_DEPARTMENT_OTHER): Payer: Self-pay | Admitting: Obstetrics & Gynecology

## 2024-12-25 ENCOUNTER — Encounter (HOSPITAL_BASED_OUTPATIENT_CLINIC_OR_DEPARTMENT_OTHER): Payer: Self-pay

## 2024-12-25 ENCOUNTER — Other Ambulatory Visit (HOSPITAL_COMMUNITY)
Admission: RE | Admit: 2024-12-25 | Discharge: 2024-12-25 | Disposition: A | Discharge status: Home / Self Care | Source: Ambulatory Visit | Attending: Obstetrics & Gynecology | Admitting: Obstetrics & Gynecology

## 2024-12-25 ENCOUNTER — Other Ambulatory Visit (HOSPITAL_BASED_OUTPATIENT_CLINIC_OR_DEPARTMENT_OTHER)

## 2024-12-25 ENCOUNTER — Ambulatory Visit (HOSPITAL_BASED_OUTPATIENT_CLINIC_OR_DEPARTMENT_OTHER)

## 2024-12-25 VITALS — BP 122/79 | HR 102 | Wt 244.2 lb

## 2024-12-25 DIAGNOSIS — N898 Other specified noninflammatory disorders of vagina: Secondary | ICD-10-CM | POA: Insufficient documentation

## 2024-12-25 DIAGNOSIS — R102 Pelvic and perineal pain unspecified side: Secondary | ICD-10-CM

## 2024-12-25 DIAGNOSIS — L292 Pruritus vulvae: Secondary | ICD-10-CM

## 2024-12-25 DIAGNOSIS — R52 Pain, unspecified: Secondary | ICD-10-CM

## 2024-12-25 LAB — BETA HCG QUANT (REF LAB): hCG Quant: 2599 m[IU]/mL

## 2024-12-25 LAB — URINE CULTURE

## 2024-12-25 NOTE — Progress Notes (Addendum)
 NURSE VISIT- VAGINITIS/STD/POC  SUBJECTIVE:  Leslie Henderson is a 29 y.o. [id number] GYN patient female here for a vaginal swab for vaginitis screening, STD screen.  She reports the following symptoms: pain and vulvar itching for several days. Denies abnormal vaginal bleeding, significant pelvic pain, fever, or UTI symptoms.  OBJECTIVE:  BP 122/79   Pulse (!) 102   Wt 244 lb 3.2 oz (110.8 kg)   LMP 11/16/2024 (Exact Date)   BMI 41.92 kg/m   Appears well, in no apparent distress  ASSESSMENT: Vaginal swab for STI and vaginitis  PLAN: Self-collected vaginal probe for Gonorrhea, Chlamydia, Trichomonas, Bacterial Vaginosis, Yeast, Yeast sent to lab Treatment: to be determined once results are received, however patient is currently on treatment for trichomonas infection.  Follow-up as needed if symptoms persist/worsen, or new symptoms develop

## 2024-12-26 LAB — CERVICOVAGINAL ANCILLARY ONLY
Bacterial Vaginitis (gardnerella): POSITIVE — AB
Candida Glabrata: NEGATIVE
Candida Vaginitis: POSITIVE — AB
Chlamydia: NEGATIVE
Comment: NEGATIVE
Comment: NEGATIVE
Comment: NEGATIVE
Comment: NEGATIVE
Comment: NEGATIVE
Comment: NORMAL
Neisseria Gonorrhea: NEGATIVE
Trichomonas: NEGATIVE

## 2024-12-27 ENCOUNTER — Other Ambulatory Visit (HOSPITAL_BASED_OUTPATIENT_CLINIC_OR_DEPARTMENT_OTHER): Payer: Self-pay

## 2024-12-27 DIAGNOSIS — B379 Candidiasis, unspecified: Secondary | ICD-10-CM

## 2024-12-27 MED ORDER — FLUCONAZOLE 150 MG PO TABS: 150.0000 mg | ORAL_TABLET | Freq: Once | ORAL | 0 refills | Status: AC

## 2024-12-30 ENCOUNTER — Ambulatory Visit (HOSPITAL_BASED_OUTPATIENT_CLINIC_OR_DEPARTMENT_OTHER): Payer: Self-pay | Admitting: Obstetrics & Gynecology

## 2025-01-02 ENCOUNTER — Other Ambulatory Visit (HOSPITAL_BASED_OUTPATIENT_CLINIC_OR_DEPARTMENT_OTHER): Payer: Self-pay

## 2025-01-02 DIAGNOSIS — Z3A01 Less than 8 weeks gestation of pregnancy: Secondary | ICD-10-CM

## 2025-01-02 MED ORDER — ONDANSETRON 4 MG PO TBDP: 4.0000 mg | ORAL_TABLET | Freq: Three times a day (TID) | ORAL | 0 refills | Status: AC | PRN

## 2025-01-15 ENCOUNTER — Ambulatory Visit (HOSPITAL_BASED_OUTPATIENT_CLINIC_OR_DEPARTMENT_OTHER)

## 2025-01-15 DIAGNOSIS — O3680X0 Pregnancy with inconclusive fetal viability, not applicable or unspecified: Secondary | ICD-10-CM

## 2025-02-11 ENCOUNTER — Encounter (HOSPITAL_BASED_OUTPATIENT_CLINIC_OR_DEPARTMENT_OTHER): Payer: Self-pay | Admitting: Obstetrics and Gynecology

## 2025-08-23 ENCOUNTER — Inpatient Hospital Stay (HOSPITAL_COMMUNITY): Admit: 2025-08-23
